# Patient Record
Sex: Male | Born: 1980 | Race: Black or African American | Hispanic: No | Marital: Single | Smoking: Current every day smoker
Health system: Southern US, Community
[De-identification: ages and names within clinical notes are randomized; demographics above are authoritative.]

## PROBLEM LIST (undated history)

## (undated) DIAGNOSIS — M549 Dorsalgia, unspecified: Secondary | ICD-10-CM

## (undated) DIAGNOSIS — M2550 Pain in unspecified joint: Secondary | ICD-10-CM

## (undated) HISTORY — DX: Dorsalgia, unspecified: M54.9

## (undated) HISTORY — PX: ANTERIOR CRUCIATE LIGAMENT REPAIR: SHX115

## (undated) HISTORY — DX: Pain in unspecified joint: M25.50

---

## 1999-03-17 HISTORY — PX: ANTERIOR CRUCIATE LIGAMENT REPAIR: SHX115

## 2002-08-31 ENCOUNTER — Encounter: Payer: Self-pay | Admitting: Orthopedic Surgery

## 2002-08-31 ENCOUNTER — Observation Stay (HOSPITAL_COMMUNITY): Admission: RE | Admit: 2002-08-31 | Discharge: 2002-09-01 | Payer: Self-pay | Admitting: Orthopedic Surgery

## 2003-01-07 ENCOUNTER — Emergency Department (HOSPITAL_COMMUNITY): Admission: EM | Admit: 2003-01-07 | Discharge: 2003-01-07 | Payer: Self-pay | Admitting: Internal Medicine

## 2003-06-28 ENCOUNTER — Emergency Department (HOSPITAL_COMMUNITY): Admission: EM | Admit: 2003-06-28 | Discharge: 2003-06-28 | Payer: Self-pay | Admitting: Emergency Medicine

## 2004-07-21 ENCOUNTER — Emergency Department (HOSPITAL_COMMUNITY): Admission: EM | Admit: 2004-07-21 | Discharge: 2004-07-21 | Payer: Self-pay | Admitting: Emergency Medicine

## 2006-12-28 ENCOUNTER — Emergency Department (HOSPITAL_COMMUNITY): Admission: EM | Admit: 2006-12-28 | Discharge: 2006-12-28 | Payer: Self-pay | Admitting: Emergency Medicine

## 2008-03-25 ENCOUNTER — Emergency Department (HOSPITAL_COMMUNITY): Admission: EM | Admit: 2008-03-25 | Discharge: 2008-03-25 | Payer: Self-pay | Admitting: Emergency Medicine

## 2008-06-14 ENCOUNTER — Emergency Department (HOSPITAL_COMMUNITY): Admission: EM | Admit: 2008-06-14 | Discharge: 2008-06-14 | Payer: Self-pay | Admitting: Emergency Medicine

## 2009-11-04 ENCOUNTER — Emergency Department (HOSPITAL_COMMUNITY): Admission: EM | Admit: 2009-11-04 | Discharge: 2009-11-05 | Payer: Self-pay | Admitting: Emergency Medicine

## 2010-08-01 NOTE — Op Note (Signed)
Martin Jones, Martin Jones [facility]                     ACCOUNT NO.:  [id number]   MEDICAL RECORD NO.:  [id number]                   PATIENT TYPE:  AMB   LOCATION:  DAY                                  FACILITY:  [facility]   PHYSICIAN:  Marlowe Kays, M.D.               DATE OF BIRTH:  08/25/1980   DATE OF PROCEDURE:  08/31/2002  DATE OF DISCHARGE:                                 OPERATIVE REPORT   PREOPERATIVE DIAGNOSIS:  Torn anterior cruciate ligament, left knee.   POSTOPERATIVE DIAGNOSIS:  Torn anterior cruciate ligament, left knee.   OPERATION:  Left knee arthroscopy and ACL reconstruction using mid third  patellar tendon technique.   SURGEON:  Illene Labrador. Aplington, M.D.   ASSISTANTS:  1. Nurse.  2. Almedia Balls. Ranell Patrick, M.D.   ANESTHESIA:  General.   PATHOLOGY AND JUSTIFICATION FOR PROCEDURE:  He injured his left knee in  April with a subsequent MRI demonstrating a torn ACL and no torn menisci.  He is here at this time for a reconstruction.   DESCRIPTION OF PROCEDURE:  Prophylactic antibiotics, satisfactory general  anesthesia, pneumatic tourniquet applied, thigh stabilizer over the  tourniquet.  Left leg was prepped with DuraPrep and draped in a sterile  field.  Anterior medial and lateral portals were marked out as well as  incision for the graft taking, starting about the mid portion of the patella  and biased to the medial side of the patellar tendon, curving down over the  proximal medial plateau medial to the tibia tubercle.  I then did my routine  arthroscopy of the joint with a superior medial inflow and found no  abnormalities other than the torn ACL.  Almedia Balls. Norris, M.D. then assisted  me in taking the graft with the incision as outlined, going down to the  patellar tendon in the patella, and we measured the patellar tendon at  roughly 36 mm in width, so I took the middle 10 mm with a 10 mm double knife  and on the tibia and patella, used cutting cautery to outline the  area for  bone resection.  The plan was to take roughly 20 mm from the patella and 25  from the tibia.  Using microsaw and using the oblique cuts and removed the  patellar bone without complication and then removed the tibial bone without  complication.  Dr. Ranell Patrick then prepared the graft, sizing, smoothing, and  placing three transverse drill holes with #1 PDS in the femoral component  and #5 Ethibond for the tibial component.  In the meantime, I was removing  the remnants of the ACL with 4.2 shaver and punch and then began the  notchplasty using a combination of curved osteotome, bur, and 4.2 shaver.  PCL was intact.  Remnants of the ACL were removed all the way into the  posterior and around the top position, and we left a little remaining bone  there as measured by  probe.  Then went ahead and made our tibial tunnel.  The tendinous portion of the graft measured 104 mm, so in order to try and  minimize bone protruding through the tibia, we obliqued the hole at 60  degrees rather than the usual 55 degrees.  The guide was placed and guidepin  inserted and overdrilled with a C reamer.  We then smoothed out bone from  around the tibial surface, and I then placed the footprint guide into the  posterior notch and made a little bit of the mark there with a guidepin and  found this was in good position, and we then went ahead and overdrilled with  the reamer, going to about 28 mm deep for the 20 mm graft.  After smoothing  up the external surface of the femur as well with the shaver, we placed a  two-pin passer.  I was then able to get the guidewire up into the femur  because of the angle, but we went ahead and pulled the graft through and  rotated it so it fit nicely in the femur and then placed the guidepin free-  hand through the anterior medial portal followed by 7 mm x 20 interference  screw.  This was tightly screwed with nice fixation of the graft.  We then  adjusted the tension on the  graft in the tibia, and there was some  protrusion of the graft despite the above-mentioned change of the angle.  We  took pictures, and the back end of the graft was nicely in the femur, and  there was no impingement in extension and then with tension adjusted  appropriately distally for the isometric tightness, I used a combination of  a bone staple proximal to the graft and distally and AO screw with washer  which went through both cortices, tightening the excision sutures on the  graft around the wire beneath the washer.  This gave a nice stable fixation  to the graft.  I then released the tourniquet at 2 hours and 7 minutes of  tourniquet time.  Marcaine 0.5% plain was placed in the wounds and portals,  and the periosteum over the staples and screw were closed with interrupted  #1 Vicryl.  Cancellous bone was placed in the patellar groove left by the  graft, and this was closed with #1 Vicryl in both the tendon and tendon  sheath overlying it with interrupted #1 Vicryl as a unit.  We continued the  closure distally.  I then introduced into the joint 20 mL of 0.5% Marcaine  with adrenalin and 4 mg of morphine.  The subcutaneous tissue was closed  with 2-0 Vicryl and all wounds with staples.  Betadine, Adaptic dry sterile  dressing were applied followed by knee immobilizer.  He tolerated the  procedure well and was taken to the recovery room in satisfactory condition  with no known complications.                                               Marlowe Kays, M.D.    JA/MEDQ  D:  08/31/2002  T:  08/31/2002  Job:  [id number]

## 2012-08-31 ENCOUNTER — Emergency Department (HOSPITAL_COMMUNITY)
Admission: EM | Admit: 2012-08-31 | Discharge: 2012-08-31 | Disposition: A | Discharge status: Home / Self Care | Payer: BC Managed Care – PPO | Attending: Emergency Medicine | Admitting: Emergency Medicine

## 2012-08-31 ENCOUNTER — Encounter (HOSPITAL_COMMUNITY): Payer: Self-pay | Admitting: *Deleted

## 2012-08-31 DIAGNOSIS — K029 Dental caries, unspecified: Secondary | ICD-10-CM | POA: Insufficient documentation

## 2012-08-31 DIAGNOSIS — L259 Unspecified contact dermatitis, unspecified cause: Secondary | ICD-10-CM | POA: Insufficient documentation

## 2012-08-31 DIAGNOSIS — K089 Disorder of teeth and supporting structures, unspecified: Secondary | ICD-10-CM | POA: Insufficient documentation

## 2012-08-31 DIAGNOSIS — K0889 Other specified disorders of teeth and supporting structures: Secondary | ICD-10-CM

## 2012-08-31 DIAGNOSIS — F17200 Nicotine dependence, unspecified, uncomplicated: Secondary | ICD-10-CM | POA: Insufficient documentation

## 2012-08-31 MED ORDER — PENICILLIN V POTASSIUM 250 MG PO TABS: 250.0000 mg | ORAL_TABLET | Freq: Four times a day (QID) | ORAL | Status: DC

## 2012-08-31 MED ORDER — HYDROCODONE-ACETAMINOPHEN 5-325 MG PO TABS: ORAL_TABLET | ORAL | Status: DC

## 2012-08-31 MED ORDER — PREDNISONE 10 MG PO TABS: ORAL_TABLET | ORAL | Status: DC

## 2012-08-31 MED ORDER — PREDNISONE 50 MG PO TABS
60.0000 mg | ORAL_TABLET | Freq: Once | ORAL | Status: AC
  Administered 2012-08-31: 60 mg via ORAL
  Filled 2012-08-31: qty 1

## 2012-08-31 MED ORDER — HYDROXYZINE HCL 25 MG PO TABS: 25.0000 mg | ORAL_TABLET | Freq: Four times a day (QID) | ORAL | Status: DC | PRN

## 2012-08-31 NOTE — ED Notes (Signed)
Rash to lt arm Headache due to dental pain

## 2012-08-31 NOTE — ED Provider Notes (Signed)
History     CSN: [id number]  Arrival date & time 08/31/12  1238   First MD Initiated Contact with Patient 08/31/12 1255      Chief Complaint  Patient presents with  . Rash     HPI Pt was seen at 1255.  Per pt, c/o gradual onset and persistence of constant left upper teeth "pain" for the past several days.  Denies fevers, no intra-oral edema, no facial swelling, no dysphagia, no neck pain.   The condition is aggravated by nothing. The condition is relieved by nothing. Pt also c/o gradual onset and persistence of constant "itchy rash" to LUE that began approx 3 to 4 days ago. Pt states he "might have touched something" before the rash began. Denies open wounds, no hives, no SOB, no wheezing/stridor, no intra-oral edema.    History reviewed. No pertinent past medical history.  Past Surgical History  Procedure Laterality Date  . Anterior cruciate ligament repair       History  Substance Use Topics  . Smoking status: Current Every Day Smoker  . Smokeless tobacco: Not on file  . Alcohol Use: Yes      Review of Systems ROS: Statement: All systems negative except as marked or noted in the HPI; Constitutional: Negative for fever and chills. ; ; Eyes: Negative for eye pain and discharge. ; ; ENMT: Positive for dental caries, dental hygiene poor and toothache. Negative for ear pain, bleeding gums, dental injury, facial deformity, facial swelling, hoarseness, nasal congestion, sinus pressure, sore throat, throat swelling and tongue swollen. ; ; Cardiovascular: Negative for chest pain, palpitations, diaphoresis, dyspnea and peripheral edema. ; ; Respiratory: Negative for cough, wheezing and stridor. ; ; Gastrointestinal: Negative for nausea, vomiting, diarrhea and abdominal pain. ; ; Genitourinary: Negative for dysuria, flank pain and hematuria. ; ; Musculoskeletal: Negative for back pain and neck pain. ; ; Skin: +itching rash. Negative for skin lesion. ; ; Neuro: Negative for headache,  lightheadedness and neck stiffness. ;    Allergies  Review of patient's allergies indicates no known allergies.  Home Medications   Current Outpatient Rx  Name  Route  Sig  Dispense  Refill  . HYDROcodone-acetaminophen (NORCO/VICODIN) 5-325 MG per tablet      1 or 2 tabs PO q6 hours prn pain   20 tablet   0   . hydrOXYzine (ATARAX/VISTARIL) 25 MG tablet   Oral   Take 1 tablet (25 mg total) by mouth every 6 (six) hours as needed for itching.   15 tablet   0   . penicillin v potassium (VEETID) 250 MG tablet   Oral   Take 1 tablet (250 mg total) by mouth 4 (four) times daily.   20 tablet   0   . predniSONE (DELTASONE) 10 MG tablet      Take 5 tablets PO x2 days, then take 4 tabs PO x3 days, then 3 tabs PO x3 days, then 2 tabs PO x3 days, then 1 tab PO x3 days   40 tablet   0     BP 132/85  Pulse 77  Temp(Src) 98.7 F (37.1 C) (Oral)  Resp 18  Ht 5\' 9"  (1.753 m)  Wt 222 lb (100.699 kg)  BMI 32.77 kg/m2  SpO2 99%  Physical Exam 1300: Physical examination: Vital signs and O2 SAT: Reviewed; Constitutional: Well developed, Well nourished, Well hydrated, In no acute distress; Head and Face: Normocephalic, Atraumatic; Eyes: EOMI, PERRL, No scleral icterus; ENMT: Mouth and pharynx normal, Poor  dentition, Widespread dental decay, Left TM normal, Right TM normal, Mucous membranes moist, +upper left premolars and molars with extensive dental decay.  No gingival erythema, edema, fluctuance, or drainage.  No intra-oral edema. No submandibular or sublingual edema. No hoarse voice, no drooling, no stridor.  ; Neck: Supple, Full range of motion, No lymphadenopathy; Cardiovascular: Regular rate and rhythm, No murmur, rub, or gallop; Respiratory: Breath sounds clear & equal bilaterally, No rales, rhonchi, wheezes, Normal respiratory effort/excursion; Chest: Nontender, Movement normal; Extremities: Pulses normal, No tenderness, No edema; Neuro: AA&Ox3, Major CN grossly intact.  No gross  focal motor or sensory deficits in extremities.; Skin: Color normal, No hives, No petechiae, Warm, Dry. +small papulovesicular rash with excoriations to LUE.    ED Course  Procedures     MDM  MDM Reviewed: previous chart, nursing note and vitals     1310:  Will tx contact dermatitis symptomatically at this time. Pt encouraged to f/u with dentist or oral surgeon for his dental needs for good continuity of care and definitive treatment.  Verb understanding.         Laray Anger, DO 08/31/12 2124

## 2012-10-01 ENCOUNTER — Encounter: Payer: Self-pay | Admitting: *Deleted

## 2012-10-03 ENCOUNTER — Encounter: Payer: Self-pay | Admitting: Family Medicine

## 2012-10-03 ENCOUNTER — Ambulatory Visit (INDEPENDENT_AMBULATORY_CARE_PROVIDER_SITE_OTHER): Payer: BC Managed Care – PPO | Admitting: Family Medicine

## 2012-10-03 VITALS — BP 132/82 | Ht 68.0 in | Wt 215.4 lb

## 2012-10-03 DIAGNOSIS — E669 Obesity, unspecified: Secondary | ICD-10-CM | POA: Insufficient documentation

## 2012-10-03 DIAGNOSIS — E785 Hyperlipidemia, unspecified: Secondary | ICD-10-CM

## 2012-10-03 DIAGNOSIS — Z0000 Encounter for general adult medical examination without abnormal findings: Secondary | ICD-10-CM

## 2012-10-03 NOTE — Patient Instructions (Signed)
You have had labs ordered as part of today's visit. These test are necessary for us to provide good care. Failure to complete the labs in a timely basis can cause significant risk to your health. It is your responsibility to do the test. These orders are good for 30 days. After 30 days they are cancelled by the system and require new orders. Labs are drawn at [facility], located across from the hospital.Please do your test as soon as possible!! All results are forwarded to you via phone call for urgent findings and via "My Chart" or letter for routine findings.Letters typically take 7 to 10 days for you to receive.If you don't receive results within 2 weeks please call us for the results.  In the future,for chronic visits we encourage you to have labs ordered and completed a few days before your visit so we can have a detailed discussion about the results. Doing labs for chronic health issues ( thyroid,diabetes, cholesterol,etc.) before your visit allows for you to have a more thorough and efficient visit.  Lab orders can be given by our office to you by request.  

## 2012-10-03 NOTE — Progress Notes (Signed)
  Subjective:    Patient ID: Martin Jones, male    DOB: Jun 17, 1980, 32 y.o.   MRN: [id number]  HPI overall this patient is doing fairly well he has had some problems in the past with his knees but nothing recently. He does do some running he tries to exercise try to lose weight he still smokes although he states he is trying to cut back and quit. He knows he should. He denies any wheezing or shortness of breath. Patient denies rectal bleeding or hematuria. He states in the past his cholesterol then elevated family history noncontributory no premature heart or cancers    Review of Systems  Constitutional: Negative for fever, activity change and appetite change.  HENT: Negative for congestion, rhinorrhea and neck pain.   Eyes: Negative for discharge.  Respiratory: Negative for cough and wheezing.   Cardiovascular: Negative for chest pain.  Gastrointestinal: Negative for vomiting, abdominal pain and blood in stool.  Genitourinary: Negative for frequency and difficulty urinating.  Skin: Negative for rash.  Allergic/Immunologic: Negative for environmental allergies and food allergies.  Neurological: Negative for weakness and headaches.  Psychiatric/Behavioral: Negative for agitation.       Objective:   Physical Exam  Constitutional: He appears well-developed and well-nourished.  HENT:  Head: Normocephalic and atraumatic.  Right Ear: External ear normal.  Left Ear: External ear normal.  Nose: Nose normal.  Mouth/Throat: Oropharynx is clear and moist.  Eyes: EOM are normal. Pupils are equal, round, and reactive to light.  Neck: Normal range of motion. Neck supple. No thyromegaly present.  Cardiovascular: Normal rate, regular rhythm and normal heart sounds.   No murmur heard. Pulmonary/Chest: Effort normal and breath sounds normal. No respiratory distress. He has no wheezes.  Abdominal: Soft. Bowel sounds are normal. He exhibits no distension and no mass. There is no tenderness.   Genitourinary: Penis normal.  Musculoskeletal: Normal range of motion. He exhibits no edema.  Lymphadenopathy:    He has no cervical adenopathy.  Neurological: He is alert. He exhibits normal muscle tone.  Skin: Skin is warm and dry. No erythema.  Psychiatric: He has a normal mood and affect. His behavior is normal. Judgment normal.          Assessment & Plan:  Wellness exam-safety measures, dietary measures, exercise all reviewed. Try to lose weight was also reviewed in addition to this lab work was ordered await the results.

## 2012-10-12 LAB — LIPID PANEL
HDL: 36 mg/dL — ABNORMAL LOW (ref >39)
LDL Cholesterol: 109 mg/dL — ABNORMAL HIGH (ref 0–99)
Triglycerides: 90 mg/dL (ref <150)
VLDL: 18 mg/dL (ref 0–40)

## 2012-10-12 LAB — BASIC METABOLIC PANEL
BUN: 10 mg/dL (ref 6–23)
Chloride: 101 mEq/L (ref 96–112)
Potassium: 4.2 mEq/L (ref 3.5–5.3)

## 2012-10-13 LAB — HIV ANTIBODY (ROUTINE TESTING W REFLEX): HIV: NONREACTIVE

## 2012-10-13 LAB — RPR

## 2013-04-07 ENCOUNTER — Ambulatory Visit (HOSPITAL_COMMUNITY)
Admission: RE | Admit: 2013-04-07 | Discharge: 2013-04-07 | Disposition: A | Discharge status: Home / Self Care | Payer: Worker's Compensation | Source: Ambulatory Visit | Attending: Orthopedic Surgery | Admitting: Orthopedic Surgery

## 2013-04-07 DIAGNOSIS — M25512 Pain in left shoulder: Secondary | ICD-10-CM | POA: Insufficient documentation

## 2013-04-07 DIAGNOSIS — IMO0001 Reserved for inherently not codable concepts without codable children: Secondary | ICD-10-CM | POA: Insufficient documentation

## 2013-04-07 DIAGNOSIS — M25519 Pain in unspecified shoulder: Secondary | ICD-10-CM | POA: Insufficient documentation
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
## 2013-04-11 ENCOUNTER — Ambulatory Visit (HOSPITAL_COMMUNITY)
Admission: RE | Admit: 2013-04-11 | Discharge: 2013-04-11 | Disposition: A | Discharge status: Home / Self Care | Payer: Worker's Compensation | Source: Ambulatory Visit | Attending: Family Medicine | Admitting: Family Medicine

## 2013-04-11 DIAGNOSIS — M25519 Pain in unspecified shoulder: Secondary | ICD-10-CM | POA: Diagnosis not present

## 2013-04-11 DIAGNOSIS — M25619 Stiffness of unspecified shoulder, not elsewhere classified: Secondary | ICD-10-CM | POA: Insufficient documentation

## 2013-04-11 DIAGNOSIS — IMO0001 Reserved for inherently not codable concepts without codable children: Secondary | ICD-10-CM | POA: Diagnosis not present

## 2013-04-11 NOTE — Progress Notes (Signed)
Occupational Therapy Treatment Patient Details  Name: Martin Jones MRN: [id number] Date of Birth: 08-Oct-1980  Today's Date: 04/11/2013 Time: 4098-1191 OT Time Calculation (min): 38 min Manual therapy [phone] 23' Therapeutic exercises [phone] 15' Visit#: 2 of 12  Re-eval: 05/05/13    Authorization: [employer] - [employer]  Authorization Time Period: unknown at time of this evaluation  Authorization Visit#: 2 of    Subjective Symptoms/Limitations Symptoms: S:  I havent done too many of my exercises I guess because I am busy  Pain Assessment Currently in Pain?: Yes Pain Score: 5  Pain Location: Shoulder Pain Orientation: Left Pain Type: Acute pain  Precautions/Restrictions     Exercise/Treatments Supine Protraction: PROM;AAROM;10 reps Horizontal ABduction: PROM;AAROM;10 reps External Rotation: PROM;AAROM;10 reps Internal Rotation: PROM;AAROM;10 reps Flexion: PROM;AAROM;10 reps ABduction: PROM;AAROM;10 reps ROM / Strengthening / Isometric Strengthening UBE (Upper Arm Bike): 3' in reverse at 1.0 for shoulder stability Proximal Shoulder Strengthening, Supine: 10 times in supine without rest breaks Ball on Wall: 1 min with arm flexed to 90 and 1 min with arm abducted to 90      Manual Therapy Manual Therapy: Myofascial release Myofascial Release: MFR  and manual stretching to left upper arm, scapular, and shoulder region to decrease pain and fascial restrictions and increase pain free mobility.   Occupational Therapy Assessment and Plan OT Assessment and Plan Clinical Impression Statement: A:  Patient was able to tolerate MFR to left shoulder without difficulty or increased pain this date.  WFL PROM and AAROM in supine this date.  OT Plan: P:  Begin AROM in supine and/or seated, add x to v and w arms for scapular stability and proximal shoulder strength.   Goals Home Exercise Program Pt/caregiver will Perform Home Exercise Program: For increased ROM;For increased  strengthening Short Term Goals Time to Complete Short Term Goals: 3 weeks Short Term Goal 1: Patient will be educated on HEP Short Term Goal 1 Progress: Progressing toward goal Short Term Goal 2: Patient will imporve LUE strenghth to >/= 3+/5 for increased ability to participate in daily activities Short Term Goal 2 Progress: Progressing toward goal Short Term Goal 3: Patient will decrease pain in his left arm to </-=4/10 with daily activities  Short Term Goal 3 Progress: Progressing toward goal Short Term Goal 4: Patient will improve LUE range to [facility] to complete daily activites independently  Short Term Goal 4 Progress: Progressing toward goal Short Term Goal 5: Patient will decrease fascial restrictions to trace in LUE  Short Term Goal 5 Progress: Progressing toward goal Long Term Goals Time to Complete Long Term Goals: 6 weeks Long Term Goal 1: APatient will return to highest level of independence with all B/IADL, work and leisure activities  Long Term Goal 1 Progress: Progressing toward goal Long Term Goal 2: Patient will have at least 4+/5 strengh throughout LUE for increased ability to perform job duties safely and return to participation in leisure activities Long Term Goal 2 Progress: Progressing toward goal Long Term Goal 3: Patient will decreased complaint of pain in LUE to </=2/10 when completing work activities Long Term Goal 3 Progress: Progressing toward goal Long Term Goal 4: Patient will have zero fascial restrictions LUE  Long Term Goal 4 Progress: Progressing toward goal  Problem List Patient Active Problem List   Diagnosis Date Noted  . Left shoulder pain 04/07/2013  . Obesity, BMI unknown 10/03/2012    End of Session Activity Tolerance: Patient tolerated treatment well General Behavior During Therapy: [facility] for tasks  assessed/performed  GO    Shirlean MylarBethany H. Ranata Laughery, OTR/L  04/11/2013, 11:04 AM

## 2013-04-14 ENCOUNTER — Ambulatory Visit (HOSPITAL_COMMUNITY)
Admission: RE | Admit: 2013-04-14 | Discharge: 2013-04-14 | Disposition: A | Discharge status: Home / Self Care | Payer: Worker's Compensation | Source: Ambulatory Visit | Attending: Family Medicine | Admitting: Family Medicine

## 2013-04-14 DIAGNOSIS — IMO0001 Reserved for inherently not codable concepts without codable children: Secondary | ICD-10-CM | POA: Diagnosis not present

## 2013-04-14 NOTE — Progress Notes (Signed)
Occupational Therapy Treatment Patient Details  Name: Martin Jones MRN: [id number] Date of Birth: 01/04/1981  Today's Date: 04/14/2013 Time: 5956-38750806-0845 OT Time Calculation (min): 39 min Manual [id number] (23') TherExercises [id number] (16')  Visit#: 3 of 12  Re-eval: 05/05/13    Authorization: [employer] - [employer]  Authorization Time Period: unknown at time of this evaluation  Authorization Visit#: 3 of    Subjective Symptoms/Limitations Symptoms: S:  My pain is  low.... not none but very low.  Pain Assessment Currently in Pain?: Yes Pain Score: 2  Pain Location: Shoulder Pain Orientation: Left Pain Type: Acute pain   Exercise/Treatments Supine Protraction: PROM;AAROM;10 reps Horizontal ABduction: PROM;AAROM;10 reps External Rotation: PROM;AAROM;10 reps Internal Rotation: PROM;AAROM;10 reps Flexion: PROM;AAROM;10 reps ABduction: PROM;AAROM;10 reps   ROM / Strengthening / Isometric Strengthening UBE (Upper Arm Bike): 3' in reverse at 2.0 for shoulder stability Proximal Shoulder Strengthening, Supine: 1' each  Ball on Wall: 1 min with arm flexed to 90 and 1 min with arm abducted to 90 with small weighted green ball.       Manual Therapy Manual Therapy: Myofascial release Myofascial Release: minimal fascial restrictions in upper arm, trapezius and scapular regions  Occupational Therapy Assessment and Plan OT Assessment and Plan Clinical Impression Statement: A:  Patient reports significant improvement with pain in his left shoulder.  Patient with good tolerance of exercises this date with min complaint of fatigue following treatment session and min inicreased pain with ABduction and ER supine exercises.   OT Plan: P:  Begin AROM in supine and/or seated, add x to v and w arms for scapular stability and proximal shoulder strength.   Goals Short Term Goals Short Term Goal 1: Patient will be educated on HEP Short Term Goal 1 Progress: Progressing toward goal Short Term  Goal 2: Patient will imporve LUE strenghth to >/= 3+/5 for increased ability to participate in daily activities Short Term Goal 2 Progress: Progressing toward goal Short Term Goal 3: Patient will decrease pain in his left arm to </-=4/10 with daily activities  Short Term Goal 3 Progress: Progressing toward goal Short Term Goal 4: Patient will improve LUE range to [facility] to complete daily activites independently  Short Term Goal 4 Progress: Progressing toward goal Short Term Goal 5: Patient will decrease fascial restrictions to trace in LUE  Short Term Goal 5 Progress: Progressing toward goal Long Term Goals Long Term Goal 1: APatient will return to highest level of independence with all B/IADL, work and leisure activities  Long Term Goal 1 Progress: Progressing toward goal Long Term Goal 2: Patient will have at least 4+/5 strengh throughout LUE for increased ability to perform job duties safely and return to participation in leisure activities Long Term Goal 2 Progress: Progressing toward goal Long Term Goal 3: Patient will decreased complaint of pain in LUE to </=2/10 when completing work activities Long Term Goal 3 Progress: Progressing toward goal Long Term Goal 4: Patient will have zero fascial restrictions LUE  Long Term Goal 4 Progress: Progressing toward goal  Problem List Patient Active Problem List   Diagnosis Date Noted  . Left shoulder pain 04/07/2013  . Obesity, BMI unknown 10/03/2012    End of Session Activity Tolerance: Patient tolerated treatment well General Behavior During Therapy: [facility] for tasks assessed/performed  GO    Velora MediateHeather Mercedees Convery, OTR/L  04/14/2013, 9:28 AM

## 2013-04-17 ENCOUNTER — Ambulatory Visit (HOSPITAL_COMMUNITY)
Admission: RE | Admit: 2013-04-17 | Discharge: 2013-04-17 | Disposition: A | Discharge status: Home / Self Care | Payer: Worker's Compensation | Source: Ambulatory Visit | Attending: Family Medicine | Admitting: Family Medicine

## 2013-04-17 DIAGNOSIS — M25519 Pain in unspecified shoulder: Secondary | ICD-10-CM | POA: Insufficient documentation

## 2013-04-17 DIAGNOSIS — M25619 Stiffness of unspecified shoulder, not elsewhere classified: Secondary | ICD-10-CM | POA: Insufficient documentation

## 2013-04-17 DIAGNOSIS — IMO0001 Reserved for inherently not codable concepts without codable children: Secondary | ICD-10-CM | POA: Insufficient documentation

## 2013-04-17 NOTE — Progress Notes (Signed)
Occupational Therapy Treatment Patient Details  Name: Martin Jones MRN: [id number] Date of Birth: 03/19/1980  Today's Date: 04/17/2013 Time: [id number] OT Time Calculation (min): 51 min Manual [id number] (20') TherExercises  [id number] (31)   Visit#: 4 of 12  Re-eval: 05/05/13    Authorization: [employer] - [employer]  Authorization Time Period: unknown at time of this evaluation  Authorization Visit#: 4 of    Subjective Symptoms/Limitations Symptoms: S:  I really feel like it is doing better Pain Assessment Currently in Pain?: Yes Pain Score: 3  Pain Location: Shoulder Pain Orientation: Left Pain Type: Acute pain     Exercise/Treatments Supine Protraction: AAROM;10 reps Horizontal ABduction: AAROM;10 reps External Rotation: AAROM;10 reps Internal Rotation: AAROM;10 reps Flexion: AAROM;10 reps ABduction: AAROM;10 reps Seated Elevation: AROM;10 reps Extension: AROM;10 reps Protraction: AROM;10 reps Horizontal ABduction: AROM;10 reps External Rotation: AROM;10 reps Internal Rotation: AROM;10 reps Flexion: AROM;10 reps Abduction: AROM;10 reps   ROM / Strengthening / Isometric Strengthening UBE (Upper Arm Bike): 3' in reverse at 2.0 for shoulder stability "W" Arms: x 10 reps X to V Arms: x 10 reps  Proximal Shoulder Strengthening, Supine: 1' each  Ball on Wall: 1 min with arm flexed to 90 and 1 min with arm abducted to 90 with small weighted green ball.         Manual Therapy Manual Therapy: Myofascial release Myofascial Release: minimal fascial restrictions in upper arm, trapezius and scapular regionstaken   Occupational Therapy Assessment and Plan OT Assessment and Plan Clinical Impression Statement: A:  Patient demos improving pain, strength and report of functional use of LUE in daily tasks.  He reports feeling much better about using his arm.  Tolerated increased AROM exercises this date.  continues with Latissimus and posterior deltoid pain with abduction  stretching.  OT Plan: P:  Proximal strengthening, wall pushups, prone or sidelying exercises.    Goals Short Term Goals Short Term Goal 1: Patient will be educated on HEP Short Term Goal 1 Progress: Progressing toward goal Short Term Goal 2: Patient will imporve LUE strenghth to >/= 3+/5 for increased ability to participate in daily activities Short Term Goal 2 Progress: Progressing toward goal Short Term Goal 3: Patient will decrease pain in his left arm to </-=4/10 with daily activities  Short Term Goal 3 Progress: Progressing toward goal Short Term Goal 4: Patient will improve LUE range to [facility] to complete daily activites independently  Short Term Goal 4 Progress: Progressing toward goal Short Term Goal 5: Patient will decrease fascial restrictions to trace in LUE  Short Term Goal 5 Progress: Progressing toward goal Long Term Goals Long Term Goal 1: APatient will return to highest level of independence with all B/IADL, work and leisure activities  Long Term Goal 1 Progress: Progressing toward goal Long Term Goal 2: Patient will have at least 4+/5 strengh throughout LUE for increased ability to perform job duties safely and return to participation in leisure activities Long Term Goal 2 Progress: Progressing toward goal Long Term Goal 3: Patient will decreased complaint of pain in LUE to </=2/10 when completing work activities Long Term Goal 3 Progress: Progressing toward goal Long Term Goal 4: Patient will have zero fascial restrictions LUE  Long Term Goal 4 Progress: Progressing toward goal Long Term Goal 5:       Problem List Patient Active Problem List   Diagnosis Date Noted  . Left shoulder pain 04/07/2013  . Obesity, BMI unknown 10/03/2012    End of Session Activity Tolerance:  Patient tolerated treatment well General Behavior During Therapy: [facility] for tasks assessed/performed  GO    Velora Mediate, OTR/L  04/17/2013, 12:32 PM

## 2013-04-20 ENCOUNTER — Ambulatory Visit (HOSPITAL_COMMUNITY)
Admission: RE | Admit: 2013-04-20 | Discharge: 2013-04-20 | Disposition: A | Discharge status: Home / Self Care | Payer: Worker's Compensation | Source: Ambulatory Visit | Attending: Family Medicine | Admitting: Family Medicine

## 2013-04-20 NOTE — Progress Notes (Signed)
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  Authorization Time Period: unknown at time of this evaluation  Authorization Visit#: 5 of    Subjective Symptoms/Limitations Symptoms: "I'm feeling great." Pain Assessment Currently in Pain?: No/denies  Exercise/Treatments Supine Protraction: AAROM;15 reps (2 lb bar) Horizontal ABduction: AAROM;15 reps (2 lb bar) External Rotation: AAROM;15 reps (2 lb bar) Internal Rotation: AAROM;15 reps (2 lb bar) Flexion: AAROM;15 reps (2 lb bar) ABduction: AAROM;15 reps (2 lb bar) Prone  Other Prone Exercises: Hughston Exercises - all 6 at 10 reps each, w 2 second hold for each   ROM / Strengthening / Isometric Strengthening UBE (Upper Arm Bike): 3' in reverse at 3.0 for shoulder stability "W" Arms: x 10 reps X to V Arms: x 10 reps  Proximal Shoulder Strengthening, Supine: 1' each criss cross, up/down; 30sec each counter/clockwise Ball on Wall: 1 min with arm flexed to 90 and 1 min with arm abducted to 90 with small weighted green ball.    Manual Therapy Manual Therapy: Myofascial release Myofascial Release: minimal fascial restrictions in upper arm, trapezius and scapular regions  Occupational Therapy Assessment and Plan OT Assessment and Plan Clinical Impression Statement: A: Pt had no pain this date during session, but did report some soreness after previous session. Pt had painfree AAROM shoulder abduction to approx 120 degrees. Added Hughston Exercises this date - tolerated well. OT Plan: Continue proximal shoulder strengthening. Add Wall pushups.   Goals Short Term Goals Short Term Goal 1: Patient will be educated on HEP Short Term Goal 1 Progress:  Progressing toward goal Short Term Goal 2: Patient will imporve LUE strenghth to >/= 3+/5 for increased ability to participate in daily activities Short Term Goal 2 Progress: Progressing toward goal Short Term Goal 3: Patient will decrease pain in his left arm to </-=4/10 with daily activities  Short Term Goal 3 Progress: Progressing toward goal Short Term Goal 4: Patient will improve LUE range to [facility] to complete daily activites independently  Short Term Goal 4 Progress: Progressing toward goal Short Term Goal 5: Patient will decrease fascial restrictions to trace in LUE  Short Term Goal 5 Progress: Progressing toward goal Long Term Goals Long Term Goal 1: APatient will return to highest level of independence with all B/IADL, work and leisure activities  Long Term Goal 1 Progress: Progressing toward goal Long Term Goal 2: Patient will have at least 4+/5 strengh throughout LUE for increased ability to perform job duties safely and return to participation in leisure activities Long Term Goal 2 Progress: Progressing toward goal Long Term Goal 3: Patient will decreased complaint of pain in LUE to </=2/10 when completing work activities Long Term Goal 3 Progress: Progressing toward goal Long Term Goal 4: Patient will have zero fascial restrictions LUE  Long Term Goal 4 Progress: Progressing toward goal  Problem List Patient Active Problem List   Diagnosis Date Noted  . Left shoulder pain 04/07/2013  . Obesity, BMI unknown 10/03/2012    End of Session Activity Tolerance: Patient tolerated treatment well General Behavior During Therapy: [facility] for tasks assessed/performed  GO    Marry GuanMarie Rawlings, MS, OTR/L [phone]  04/20/2013, 11:59 AM

## 2013-04-24 ENCOUNTER — Ambulatory Visit (HOSPITAL_COMMUNITY)
Admission: RE | Admit: 2013-04-24 | Discharge: 2013-04-24 | Disposition: A | Discharge status: Home / Self Care | Payer: Worker's Compensation | Source: Ambulatory Visit | Attending: Family Medicine | Admitting: Family Medicine

## 2013-04-24 NOTE — Progress Notes (Addendum)
Occupational Therapy Treatment Patient Details  Name: Martin Jones MRN: [id number] Date of Birth: 09/21/1980  Today's Date: 04/24/2013 Time: 0805-0852 OT Time Calculation (min): 47 min Manual [id number] (10') therExercises [id number] (37')  Visit#: 6 of 12  Re-eval: 05/05/13    Authorization: [employer] - [employer]BS  Authorization Time Period: unknown at time of this evaluation  Authorization Visit#: 6 of    Subjective Symptoms/Limitations Symptoms: S:  I have been working but I am still on light duty  Pain Assessment Currently in Pain?: Yes Pain Score: 2  Pain Location: Shoulder Pain Orientation: Left Pain Type: Acute pain   Exercise/Treatments Prone  Other Prone Exercises: Hughston exercises with 2# hand weights over blue thereapy ball x 12 reps each  Therapy Ball Other Therapy Ball Exercises: supine on ball for strengthening exercises to include chest press, horizontal ab/adduction x 12 repss each.  ROM / Strengthening / Isometric Strengthening UBE (Upper Arm Bike): 3' in reverse at 3.5 for shoulder stability, 3' forward with 3.5 for UE endurance/strengthening.   Cybex Press: 10 reps;3 plate Cybex Row: 10 reps;3 plate Pushups: 15 reps (12 reps on ball side of BOSU) Plank: 2 reps;45 seconds;30 seconds (on BOSU, ball side down. ) X to V Arms: x12 reps  Proximal Shoulder Strengthening, Seated: in standing with 2# hand weights x 1min each  Ball on Wall: 1' each with arms flexed to 90 degrees.        Manual Therapy Manual Therapy: Myofascial release Myofascial Release: minimal fascial restrictions in upper arm, trapezius and scapular regions  Occupational Therapy Assessment and Plan OT Assessment and Plan Clinical Impression Statement: A:  Patient wiht improved complaint of pain however he reports increased fatigue in LUE wiht increased functional use.  Attempted wall pushups with patient this date which were too easy, therefore progressed to standard pushups on floor/mat  then on BOSU with fatigue.  Patient with increased complaint of LUE fatigue following treatment this date.  instructed to work on pushups at home at least once a day for scapular strengthening.  OT Plan: P:  cont Hughston exercises over ball, BOSU shoulder stability    Goals Short Term Goals Short Term Goal 1: Patient will be educated on HEP Short Term Goal 1 Progress: Progressing toward goal Short Term Goal 2: Patient will imporve LUE strenghth to >/= 3+/5 for increased ability to participate in daily activities Short Term Goal 2 Progress: Progressing toward goal Short Term Goal 3: Patient will decrease pain in his left arm to </-=4/10 with daily activities  Short Term Goal 3 Progress: Progressing toward goal Short Term Goal 4: Patient will improve LUE range to [facility] to complete daily activites independently  Short Term Goal 4 Progress: Progressing toward goal Short Term Goal 5: Patient will decrease fascial restrictions to trace in LUE  Short Term Goal 5 Progress: Progressing toward goal Long Term Goals Long Term Goal 1: APatient will return to highest level of independence with all B/IADL, work and leisure activities  Long Term Goal 1 Progress: Progressing toward goal Long Term Goal 2: Patient will have at least 4+/5 strengh throughout LUE for increased ability to perform job duties safely and return to participation in leisure activities Long Term Goal 2 Progress: Progressing toward goal Long Term Goal 3: Patient will decreased complaint of pain in LUE to </=2/10 when completing work activities Long Term Goal 3 Progress: Progressing toward goal Long Term Goal 4: Patient will have zero fascial restrictions LUE  Long Term Goal 4 Progress:  Progressing toward goal  Problem List Patient Active Problem List   Diagnosis Date Noted  . Left shoulder pain 04/07/2013  . Obesity, BMI unknown 10/03/2012    End of Session Activity Tolerance: Patient tolerated treatment well General Behavior  During Therapy: [facility] for tasks assessed/performed  GO    Velora Mediate, OTR/L  04/24/2013, 9:55 AM

## 2013-04-26 ENCOUNTER — Ambulatory Visit (HOSPITAL_COMMUNITY)
Admission: RE | Admit: 2013-04-26 | Discharge: 2013-04-26 | Disposition: A | Discharge status: Home / Self Care | Payer: Worker's Compensation | Source: Ambulatory Visit | Attending: Family Medicine | Admitting: Family Medicine

## 2013-04-26 NOTE — Progress Notes (Signed)
Occupational Therapy Treatment Patient Details  Name: Martin Jones MRN: [id number] Date of Birth: 04/24/1980  Today's Date: 04/26/2013 Time: 0813-0850 OT Time Calculation (min): 37 min TherExercises 4098-1191 (37')  Visit#: 7 of 12  Re-eval: 05/05/13    Authorization: [employer] - [employer]  Authorization Time Period: unknown at time of this evaluation  Authorization Visit#: 7 of    Subjective Symptoms/Limitations Symptoms: S:  I really dont have any pain so i have been doing push ups at home  Pain Assessment Currently in Pain?: No/denies  Exercise/Treatments Seated Other Seated Exercises: power column seated for overhead flexion pull down x 12 reps  Prone  Other Prone Exercises: Hughston exercises with 2# hand weights over blue thereapy ball x 15 reps each  Standing External Rotation: AROM;15 reps;Theraband Theraband Level (Shoulder External Rotation): Level 4 (Blue) Internal Rotation: AROM;12 reps;Theraband Theraband Level (Shoulder Internal Rotation): Level 4 (Blue) Other Standing Exercises: high pulls at power column x 12 reps plate 5 ROM / Strengthening / Isometric Strengthening Cybex Press:  (12 reps, plate 6 ) Cybex Row:  (12 reps, plate 6) Pushups: 15 reps (feet on BOSU and hands on foam) Plank: 60 seconds;2 reps (stationary x 1 rep; mountain climers x 10 during second rep ) X to V Arms: x12 reps with 2# hand weights Other ROM/Strengthening Exercises: Plank position to walk hands off foam square laterally x 2 sets x 5 reps each.       Occupational Therapy Assessment and Plan OT Assessment and Plan Clinical Impression Statement: A:  Patient running late this AM, states he has no pain and agrees to skip manual MFR this date.  Patient with improved plank tolerance this date on unstable surface.  He reports cont weakness with IR/ER movements. increased tolerance of Hughston exercises with 2# weights increased reps.  increased weight with cybex exercises with good  tolerance.  initiated theraband exercises.   OT Plan: P:  Strengthening, shoulder stability    Goals Short Term Goals Short Term Goal 1: Patient will be educated on HEP Short Term Goal 1 Progress: Progressing toward goal Short Term Goal 2: Patient will imporve LUE strenghth to >/= 3+/5 for increased ability to participate in daily activities Short Term Goal 2 Progress: Progressing toward goal Short Term Goal 3: Patient will decrease pain in his left arm to </-=4/10 with daily activities  Short Term Goal 3 Progress: Progressing toward goal Short Term Goal 4: Patient will improve LUE range to [facility] to complete daily activites independently  Short Term Goal 4 Progress: Progressing toward goal Short Term Goal 5: Patient will decrease fascial restrictions to trace in LUE  Short Term Goal 5 Progress: Progressing toward goal Long Term Goals Long Term Goal 1: APatient will return to highest level of independence with all B/IADL, work and leisure activities  Long Term Goal 1 Progress: Progressing toward goal Long Term Goal 2: Patient will have at least 4+/5 strengh throughout LUE for increased ability to perform job duties safely and return to participation in leisure activities Long Term Goal 2 Progress: Progressing toward goal Long Term Goal 3: Patient will decreased complaint of pain in LUE to </=2/10 when completing work activities Long Term Goal 4: Patient will have zero fascial restrictions LUE  Long Term Goal 4 Progress: Progressing toward goal  Problem List Patient Active Problem List   Diagnosis Date Noted  . Left shoulder pain 04/07/2013  . Obesity, BMI unknown 10/03/2012    End of Session Activity Tolerance: Patient tolerated treatment  well General Behavior During Therapy: [facility] for tasks assessed/performed  GO    Velora MediateHeather Jamair Cato, OTR/L  04/26/2013, 10:15 AM

## 2013-05-02 ENCOUNTER — Ambulatory Visit (HOSPITAL_COMMUNITY): Payer: Self-pay | Admitting: Specialist

## 2013-05-04 ENCOUNTER — Ambulatory Visit (HOSPITAL_COMMUNITY)
Admission: RE | Admit: 2013-05-04 | Discharge: 2013-05-04 | Disposition: A | Discharge status: Home / Self Care | Payer: Worker's Compensation | Source: Ambulatory Visit | Attending: Family Medicine | Admitting: Family Medicine

## 2013-05-04 ENCOUNTER — Inpatient Hospital Stay (HOSPITAL_COMMUNITY): Admission: RE | Admit: 2013-05-04 | Payer: Self-pay | Source: Ambulatory Visit

## 2013-05-04 NOTE — Progress Notes (Signed)
Occupational Therapy Treatment Patient Details  Name: Martin Jones MRN: [id number] Date of Birth: 01/17/1981  Today's Date: 05/04/2013 Time: 1100-1146 OT Time Calculation (min): 46 min Manual 1100-1110 (10') Therapeutic Exercises 1110-1146 (36')  Visit#: 8 of 12  Re-eval: 05/05/13    Authorization: [employer] - [employer]  Authorization Time Period: unknown at time of this evaluation  Authorization Visit#: 8 of    Subjective Symptoms/Limitations Symptoms: S: "I don't have any pain... but it still doesn't feel normal." Pain Assessment Currently in Pain?: No/denies  Exercise/Treatments Seated Other Seated Exercises: power column seated for overhead extension pull down x 20 reps (8 bar) Prone  Other Prone Exercises: Hughston exercises with 2# hand weights over blue thereapy ball x 20 reps each    ROM / Strengthening / Isometric Strengthening Cybex Press: 20 reps (6 plate) Cybex Row: 20 reps (6 plate) Plank:  (25" x1, 45" x3) Proximal Shoulder Strengthening, Seated: in standing with 2# hand weights x 1min each  Ball on Wall: 1' each arm flexed to 90 and abducted to 90 Other ROM/Strengthening Exercises: Plank position to walk hands off Bosu laterally and anteriorly. 30" x2 reps      Manual Therapy Manual Therapy: Myofascial release Myofascial Release: minimal fascial restrictions in upper arm, trapezius and scapular regions  Occupational Therapy Assessment and Plan OT Assessment and Plan Clinical Impression Statement: A: Pt with minimal fascial restrictions this date in upper trap region. Pt remained fatigued this session - also commented that he has not continued hughston exercises at home. Required cueing for plank positioning. OT Plan: P: Re-Evaluation. Hughston exercises (improve toelrance of current reps), quadruped activities. Introduce theraband HEP.   Goals Short Term Goals Short Term Goal 1: Patient will be educated on HEP Short Term Goal 1 Progress: Progressing  toward goal Short Term Goal 2: Patient will imporve LUE strenghth to >/= 3+/5 for increased ability to participate in daily activities Short Term Goal 2 Progress: Progressing toward goal Short Term Goal 3: Patient will decrease pain in his left arm to </-=4/10 with daily activities  Short Term Goal 3 Progress: Progressing toward goal Short Term Goal 4: Patient will improve LUE range to [facility] to complete daily activites independently  Short Term Goal 4 Progress: Progressing toward goal Short Term Goal 5: Patient will decrease fascial restrictions to trace in LUE  Short Term Goal 5 Progress: Progressing toward goal Long Term Goals Long Term Goal 1: APatient will return to highest level of independence with all B/IADL, work and leisure activities  Long Term Goal 1 Progress: Progressing toward goal Long Term Goal 2: Patient will have at least 4+/5 strengh throughout LUE for increased ability to perform job duties safely and return to participation in leisure activities Long Term Goal 2 Progress: Progressing toward goal Long Term Goal 3: Patient will decreased complaint of pain in LUE to </=2/10 when completing work activities Long Term Goal 3 Progress: Progressing toward goal Long Term Goal 4: Patient will have zero fascial restrictions LUE  Long Term Goal 4 Progress: Progressing toward goal  Problem List Patient Active Problem List   Diagnosis Date Noted  . Left shoulder pain 04/07/2013  . Obesity, BMI unknown 10/03/2012    End of Session Activity Tolerance: Patient tolerated treatment well General Behavior During Therapy: [facility] for tasks assessed/performed  GO    Milon DikesRawlings, Machele Deihl A 05/04/2013, 12:00 PM

## 2013-05-09 ENCOUNTER — Inpatient Hospital Stay (HOSPITAL_COMMUNITY): Admission: RE | Admit: 2013-05-09 | Payer: Self-pay | Source: Ambulatory Visit

## 2013-05-11 ENCOUNTER — Ambulatory Visit (HOSPITAL_COMMUNITY): Payer: Self-pay

## 2013-05-16 ENCOUNTER — Ambulatory Visit (HOSPITAL_COMMUNITY)
Admission: RE | Admit: 2013-05-16 | Discharge: 2013-05-16 | Disposition: A | Discharge status: Home / Self Care | Payer: Worker's Compensation | Source: Ambulatory Visit | Attending: Family Medicine | Admitting: Family Medicine

## 2013-05-16 DIAGNOSIS — M25619 Stiffness of unspecified shoulder, not elsewhere classified: Secondary | ICD-10-CM | POA: Insufficient documentation

## 2013-05-16 DIAGNOSIS — IMO0001 Reserved for inherently not codable concepts without codable children: Secondary | ICD-10-CM | POA: Insufficient documentation

## 2013-05-16 DIAGNOSIS — M25519 Pain in unspecified shoulder: Secondary | ICD-10-CM | POA: Insufficient documentation

## 2013-05-16 NOTE — Evaluation (Signed)
Occupational Therapy Re-Evaluation  Patient Details  Name: Martin Jones MRN: [id number] Date of Birth: 1980-12-01  Today's Date: 05/16/2013 Time: 4098-1191 OT Time Calculation (min): 15 min ROM/MMT assessment 15' Visit#: 9 of 12  Re-eval: 05/16/13  Assessment Diagnosis: left shoulder dislocation   Authorization: [place]  Past Medical History: No past medical history on file. Past Surgical History:  Past Surgical History  Procedure Laterality Date  . Anterior cruciate ligament repair    . Anterior cruciate ligament repair Left 2001    Subjective  S:  It really feels back to normal.   Pain Assessment Currently in Pain?: No/denies Pain Score: 0-No pain   Assessment ADL/Vision/Perception ADL ADL Comments: able to complete all B/IADLs and work activities.  Has not resumed work out or weightlifting and feels that he could do so.   Additional Assessments LUE AROM (degrees) LUE Overall AROM Comments: assessed in standing ( 04/07/13) Left Shoulder Flexion: 170 Degrees (94) Left Shoulder ABduction: 165 Degrees (73) Left Shoulder Internal Rotation: 70 Degrees (70) Left Shoulder External Rotation: 80 Degrees (49) LUE Strength LUE Overall Strength Comments: assessed in standing ER/IR with shoulder abducted to 90 (04/07/13) Left Shoulder Flexion: 5/5 (3-/5) Left Shoulder Extension: 5/5 (3/5) Left Shoulder ABduction: 5/5 (2+/5) Left Shoulder Internal Rotation: 5/5 (3+/5) Left Shoulder External Rotation: 5/5 (2/5) Palpation Palpation: trace fascial restrictions (minimal fascial restrictions)     Occupational Therapy Assessment and Plan OT Assessment and Plan Clinical Impression Statement: A:  Patient has met all OT goals.  He is functioning at his prior level of independence with all B/IADLs and work activities.  He feels ready for dc at this time.  OT Plan: P:  DC from skilled OT interventions this date.   Goals Short Term Goals Short Term Goal 1: Patient will  be educated on HEP Short Term Goal 1 Progress: Met Short Term Goal 2: Patient will imporve LUE strenghth to >/= 3+/5 for increased ability to participate in daily activities Short Term Goal 2 Progress: Met Short Term Goal 3: Patient will decrease pain in his left arm to </-=4/10 with daily activities  Short Term Goal 3 Progress: Met Short Term Goal 4: Patient will improve LUE range to [facility] to complete daily activites independently  Short Term Goal 4 Progress: Met Short Term Goal 5: Patient will decrease fascial restrictions to trace in LUE  Short Term Goal 5 Progress: Met Long Term Goals Long Term Goal 1: APatient will return to highest level of independence with all B/IADL, work and leisure activities  Long Term Goal 1 Progress: Met Long Term Goal 2: Patient will have at least 4+/5 strengh throughout LUE for increased ability to perform job duties safely and return to participation in leisure activities Long Term Goal 2 Progress: Met Long Term Goal 3: Patient will decreased complaint of pain in LUE to </=2/10 when completing work activities Long Term Goal 3 Progress: Met Long Term Goal 4: Patient will have zero fascial restrictions LUE  Long Term Goal 4 Progress: Met  Problem List Patient Active Problem List   Diagnosis Date Noted  . Left shoulder pain 04/07/2013  . Obesity, BMI unknown 10/03/2012    End of Session Activity Tolerance: Patient tolerated treatment well General Behavior During Therapy: [facility] for tasks assessed/performed OT Plan of Care OT Home Exercise Plan: shoulder strengthening exercises OT Patient Instructions: handout given and scanned Consulted and Agree with Plan of Care: Patient  [place], OTR/L  05/16/2013,  10:29 AM  Physician Documentation Your signature is required to indicate approval of the treatment plan as stated above.  Please sign and either send electronically or make a copy of this report for your files and return this physician  signed original.  Please mark one 1.__approve of plan  2. ___approve of plan with the following conditions.   ______________________________                                                          _____________________ Physician Signature                                                                                                             Date

## 2013-05-18 ENCOUNTER — Inpatient Hospital Stay (HOSPITAL_COMMUNITY): Admission: RE | Admit: 2013-05-18 | Payer: Self-pay | Source: Ambulatory Visit

## 2013-11-07 ENCOUNTER — Emergency Department (HOSPITAL_COMMUNITY)
Admission: EM | Admit: 2013-11-07 | Discharge: 2013-11-07 | Disposition: A | Discharge status: Home / Self Care | Payer: Managed Care, Other (non HMO) | Attending: Emergency Medicine | Admitting: Emergency Medicine

## 2013-11-07 ENCOUNTER — Encounter (HOSPITAL_COMMUNITY): Payer: Self-pay | Admitting: Emergency Medicine

## 2013-11-07 DIAGNOSIS — J029 Acute pharyngitis, unspecified: Secondary | ICD-10-CM | POA: Diagnosis not present

## 2013-11-07 DIAGNOSIS — F17200 Nicotine dependence, unspecified, uncomplicated: Secondary | ICD-10-CM | POA: Diagnosis not present

## 2013-11-07 MED ORDER — HYDROCODONE-ACETAMINOPHEN 7.5-325 MG/15ML PO SOLN
10.0000 mL | Freq: Once | ORAL | Status: AC
  Administered 2013-11-07: 10 mL via ORAL
  Filled 2013-11-07: qty 15

## 2013-11-07 MED ORDER — GUAIFENESIN-CODEINE 100-10 MG/5ML PO SYRP: 10.0000 mL | ORAL_SOLUTION | Freq: Three times a day (TID) | ORAL | Status: DC | PRN

## 2013-11-07 MED ORDER — MAGIC MOUTHWASH W/LIDOCAINE: 5.0000 mL | Freq: Three times a day (TID) | ORAL | Status: DC | PRN

## 2013-11-07 NOTE — ED Notes (Signed)
Pt c/o congestion and sore throat. 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 States it has been going on for two weeks.

## 2013-11-07 NOTE — Discharge Instructions (Signed)

## 2013-11-09 NOTE — ED Provider Notes (Signed)
CSN: [id number]     Arrival date & time 11/07/13  2107 History   First MD Initiated Contact with Patient 11/07/13 2202     Chief Complaint  Patient presents with  . Sore Throat   Martin Jones is a 33 y.o. male who presents to the Emergency Department complaining of sore throat and congestion for nearly two weeks.  He states that he has been treating with tylenol.  He states his sore throat became worse today after using drain cleaner to remove a clogged sink and he thinks he may have inhaled some of the fumes.  He states that his throat feels irritated.  He denies shortness of breath, vomiting, fever, chest pain or difficulty swallowing  (Consider location/radiation/quality/duration/timing/severity/associated sxs/prior Treatment) Patient is a 34 y.o. male presenting with pharyngitis. The history is provided by the patient.  Sore Throat This is a new problem. The current episode started 1 to 4 weeks ago. The problem occurs constantly. The problem has been unchanged. Associated symptoms include congestion, coughing and a sore throat. Pertinent negatives include no abdominal pain, arthralgias, chest pain, chills, fever, headaches, joint swelling, myalgias, nausea, neck pain, numbness, rash, swollen glands, urinary symptoms, vertigo, vomiting or weakness. The symptoms are aggravated by swallowing. He has tried acetaminophen for the symptoms. The treatment provided mild relief.    History reviewed. No pertinent past medical history. Past Surgical History  Procedure Laterality Date  . Anterior cruciate ligament repair    . Anterior cruciate ligament repair Left 2001   Family History  Problem Relation Age of Onset  . Hypertension Mother   . Hyperlipidemia Mother   . Diabetes Other    History  Substance Use Topics  . Smoking status: Current Every Day Smoker    Types: Cigarettes  . Smokeless tobacco: Not on file  . Alcohol Use: Yes    Review of Systems  Constitutional: Negative for fever,  chills, activity change and appetite change.  HENT: Positive for congestion, sneezing and sore throat. Negative for facial swelling, rhinorrhea and trouble swallowing.   Eyes: Negative for visual disturbance.  Respiratory: Positive for cough. Negative for chest tightness, shortness of breath, wheezing and stridor.   Cardiovascular: Negative for chest pain.  Gastrointestinal: Negative for nausea, vomiting and abdominal pain.  Musculoskeletal: Negative for arthralgias, joint swelling, myalgias, neck pain and neck stiffness.  Skin: Negative.  Negative for rash.  Neurological: Negative for dizziness, vertigo, weakness, numbness and headaches.  Hematological: Negative for adenopathy.  Psychiatric/Behavioral: Negative for confusion.  All other systems reviewed and are negative.     Allergies  Review of patient's allergies indicates no known allergies.  Home Medications   Prior to Admission medications   Medication Sig Start Date End Date Taking? Authorizing Provider  Alum & Mag Hydroxide-Simeth (MAGIC MOUTHWASH W/LIDOCAINE) SOLN Take 5 mLs by mouth 3 (three) times daily as needed for mouth pain. Swish and spit, do not swallow 11/07/13   Mikaelah Trostle L. Moncia Annas, PA-C  guaiFENesin-codeine (ROBITUSSIN AC) 100-10 MG/5ML syrup Take 10 mLs by mouth 3 (three) times daily as needed. 11/07/13   Terryn Rosenkranz L. Bryonna Sundby, PA-C   BP 127/80  Pulse 60  Temp(Src) 98.9 F (37.2 C) (Oral)  Resp 20  SpO2 97% Physical Exam  Nursing note and vitals reviewed. Constitutional: He is oriented to person, place, and time. He appears well-developed and well-nourished. No distress.  HENT:  Head: Normocephalic and atraumatic.  Right Ear: Tympanic membrane and ear canal normal.  Left Ear: Tympanic membrane and ear canal  normal.  Mouth/Throat: Uvula is midline and mucous membranes are normal. No trismus in the jaw. No uvula swelling. Posterior oropharyngeal erythema present. No oropharyngeal exudate, posterior oropharyngeal  edema or tonsillar abscesses.  Eyes: Conjunctivae are normal. Pupils are equal, round, and reactive to light.  Neck: Normal range of motion. Neck supple.  Cardiovascular: Normal rate, regular rhythm, normal heart sounds and intact distal pulses.   No murmur heard. Pulmonary/Chest: Effort normal and breath sounds normal. No respiratory distress. He has no wheezes. He has no rales. He exhibits no tenderness.  Abdominal: Soft. He exhibits no distension. There is no splenomegaly. There is no tenderness. There is no rebound and no guarding.  Musculoskeletal: Normal range of motion.  Lymphadenopathy:    He has no cervical adenopathy.  Neurological: He is alert and oriented to person, place, and time. He exhibits normal muscle tone. Coordination normal.  Skin: Skin is warm and dry. No rash noted.  Psychiatric: He has a normal mood and affect.    ED Course  Procedures (including critical care time) Labs Review Labs Reviewed - No data to display  Imaging Review No results found.   EKG Interpretation None      MDM   Final diagnoses:  Pharyngitis    Pt is well appearing, lungs clear, non-toxic appearing.  No PTA or cervical lymphadenopathy.  Pt agrees to symptomatic tx with magic mouthwash and robitussin AC for cough.  He agrees to close f/u or to return here if sx's worsen.  He appears stable for d/c    Mike Hamre L. Trisha Mangle, PA-C 11/09/13 1436

## 2013-11-10 NOTE — ED Provider Notes (Signed)
Medical screening examination/treatment/procedure(s) were performed by non-physician practitioner and as supervising physician I was immediately available for consultation/collaboration.   EKG Interpretation None        Benny Lennert, MD 11/10/13 [phone]

## 2014-02-05 ENCOUNTER — Encounter (HOSPITAL_COMMUNITY): Payer: Self-pay

## 2014-02-05 ENCOUNTER — Emergency Department (HOSPITAL_COMMUNITY): Payer: Managed Care, Other (non HMO)

## 2014-02-05 ENCOUNTER — Emergency Department (HOSPITAL_COMMUNITY)
Admission: EM | Admit: 2014-02-05 | Discharge: 2014-02-05 | Disposition: A | Discharge status: Home / Self Care | Payer: Managed Care, Other (non HMO) | Attending: Emergency Medicine | Admitting: Emergency Medicine

## 2014-02-05 DIAGNOSIS — W228XXA Striking against or struck by other objects, initial encounter: Secondary | ICD-10-CM | POA: Insufficient documentation

## 2014-02-05 DIAGNOSIS — Z720 Tobacco use: Secondary | ICD-10-CM | POA: Insufficient documentation

## 2014-02-05 DIAGNOSIS — Y9289 Other specified places as the place of occurrence of the external cause: Secondary | ICD-10-CM | POA: Insufficient documentation

## 2014-02-05 DIAGNOSIS — S99922A Unspecified injury of left foot, initial encounter: Secondary | ICD-10-CM | POA: Diagnosis present

## 2014-02-05 DIAGNOSIS — Y998 Other external cause status: Secondary | ICD-10-CM | POA: Diagnosis not present

## 2014-02-05 DIAGNOSIS — Y9301 Activity, walking, marching and hiking: Secondary | ICD-10-CM | POA: Diagnosis not present

## 2014-02-05 DIAGNOSIS — Y9389 Activity, other specified: Secondary | ICD-10-CM | POA: Insufficient documentation

## 2014-02-05 DIAGNOSIS — S92424A Nondisplaced fracture of distal phalanx of right great toe, initial encounter for closed fracture: Secondary | ICD-10-CM | POA: Insufficient documentation

## 2014-02-05 DIAGNOSIS — T1490XA Injury, unspecified, initial encounter: Secondary | ICD-10-CM

## 2014-02-05 DIAGNOSIS — S92912A Unspecified fracture of left toe(s), initial encounter for closed fracture: Secondary | ICD-10-CM

## 2014-02-05 MED ORDER — IBUPROFEN 600 MG PO TABS: 600.0000 mg | ORAL_TABLET | Freq: Four times a day (QID) | ORAL | Status: DC | PRN

## 2014-02-05 MED ORDER — HYDROCODONE-ACETAMINOPHEN 5-325 MG PO TABS: 1.0000 | ORAL_TABLET | ORAL | Status: DC | PRN

## 2014-02-05 NOTE — Discharge Instructions (Signed)
Buddy Taping of Toes °We have taped your toes together to keep them from moving. This is called "buddy taping" since we used a part of your own body to keep the injured part still. We placed soft padding between your toes to keep them from rubbing against each other. Buddy taping will help with healing and to reduce pain. Keep your toes buddy taped together for as long as directed by your caregiver. °HOME CARE INSTRUCTIONS  °· Raise your injured area above the level of your heart while sitting or lying down. Prop it up with pillows. °· An ice pack used every twenty minutes, while awake, for the first one to two days may be helpful. Put ice in a plastic bag and put a towel between the bag and your skin. °· Watch for signs that the taping is too tight. These signs may be: °· Numbness of your taped toes. °· Coolness of your taped toes. °· Color change in the area beyond the tape. °· Increased pain. °· If you have any of these signs, loosen or rewrap the tape. If you need to loosen or rewrap the buddy tape, make sure you use the padding again. °SEEK IMMEDIATE MEDICAL CARE IF:  °· You have worse pain, swelling, inflammation (soreness), drainage or bleeding after you rewrap the tape. °· Any new problems occur. °MAKE SURE YOU:  °· Understand these instructions. °· Will watch your condition. °· Will get help right away if you are not doing well or get worse. °Document Released: 12/05/2003 Document Revised: 05/25/2011 Document Reviewed: 02/28/2008 °ExitCare® Patient Information ©2015 [employer]. This information is not intended to replace advice given to you by your health care provider. Make sure you discuss any questions you have with your health care provider. ° °Toe Fracture °Your caregiver has diagnosed you as having a fractured toe. A toe fracture is a break in the bone of a toe. "Buddy taping" is a way of splinting your broken toe, by taping the broken toe to the toe next to it. This "buddy taping" will keep the  injured toe from moving beyond normal range of motion. Buddy taping also helps the toe heal in a more normal alignment. It may take 6 to 8 weeks for the toe injury to heal. °HOME CARE INSTRUCTIONS  °· Leave your toes taped together for as long as directed by your caregiver or until you see a doctor for a follow-up examination. You can change the tape after bathing. Always use a small piece of gauze or cotton between the toes when taping them together. This will help the skin stay dry and prevent infection. °· Apply ice to the injury for 15-20 minutes each hour while awake for the first 2 days. Put the ice in a plastic bag and place a towel between the bag of ice and your skin. °· After the first 2 days, apply heat to the injured area. Use heat for the next 2 to 3 days. Place a heating pad on the foot or soak the foot in warm water as directed by your caregiver. °· Keep your foot elevated as much as possible to lessen swelling. °· Wear sturdy, supportive shoes. The shoes should not pinch the toes or fit tightly against the toes. °· Your caregiver may prescribe a rigid shoe if your foot is very swollen. °· Your may be given crutches if the pain is too great and it hurts too much to walk. °· Only take over-the-counter or prescription medicines for pain, discomfort,   or fever as directed by your caregiver.  If your caregiver has given you a follow-up appointment, it is very important to keep that appointment. Not keeping the appointment could result in a chronic or permanent injury, pain, and disability. If there is any problem keeping the appointment, you must call back to this facility for assistance. SEEK MEDICAL CARE IF:   You have increased pain or swelling, not relieved with medications.  The pain does not get better after 1 week.  Your injured toe is cold when the others are warm. SEEK IMMEDIATE MEDICAL CARE IF:   The toe becomes cold, numb, or white.  The toe becomes hot (inflamed) and  red. Document Released: 02/28/2000 Document Revised: 05/25/2011 Document Reviewed: 10/17/2007 [facility] Patient Information 2015 [place], [place]. This information is not intended to replace advice given to you by your health care provider. Make sure you discuss any questions you have with your health care provider.  You may take the hydrocodone prescribed for pain relief.  This will make you drowsy - do not drive within 4 hours of taking this medication.

## 2014-02-05 NOTE — ED Provider Notes (Signed)
CSN: [id number]     Arrival date & time 02/05/14  2105 History   First MD Initiated Contact with Patient 02/05/14 2204     Chief Complaint  Patient presents with  . Foot Injury     (Consider location/radiation/quality/duration/timing/severity/associated sxs/prior Treatment) The history is provided by the patient.   Martin Jones is a 33 y.o. male present with right great toe and foot pain after striking his foot on a metal bar while walking last night.  His pain is tolerable at rest, worsened with walking and weight bearing.  He has taken no medications and has found no alleviators for symptoms except for rest.  He reports a history of fracture to his right 1st metatarsal occuring several years ago which does continue to cause fairly frequent pain.  He was not followed by an orthopedist for this previous injury.     History reviewed. No pertinent past medical history. Past Surgical History  Procedure Laterality Date  . Anterior cruciate ligament repair    . Anterior cruciate ligament repair Left 2001   Family History  Problem Relation Age of Onset  . Hypertension Mother   . Hyperlipidemia Mother   . Diabetes Other    History  Substance Use Topics  . Smoking status: Current Every Day Smoker -- 0.50 packs/day    Types: Cigarettes  . Smokeless tobacco: Not on file  . Alcohol Use: Yes     Comment: occasional    Review of Systems  Constitutional: Negative for fever.  Musculoskeletal: Positive for joint swelling and arthralgias. Negative for myalgias.  Neurological: Negative for weakness and numbness.      Allergies  Review of patient's allergies indicates no known allergies.  Home Medications   Prior to Admission medications   Medication Sig Start Date End Date Taking? Authorizing Provider  Alum & Mag Hydroxide-Simeth (MAGIC MOUTHWASH W/LIDOCAINE) SOLN Take 5 mLs by mouth 3 (three) times daily as needed for mouth pain. Swish and spit, do not swallow Patient not  taking: Reported on 02/05/2014 11/07/13   Tammy L. Triplett, PA-C  guaiFENesin-codeine (ROBITUSSIN AC) 100-10 MG/5ML syrup Take 10 mLs by mouth 3 (three) times daily as needed. Patient not taking: Reported on 02/05/2014 11/07/13   Tammy L. Triplett, PA-C  HYDROcodone-acetaminophen (NORCO/VICODIN) 5-325 MG per tablet Take 1 tablet by mouth every 4 (four) hours as needed. 02/05/14   Burgess AmorJulie Amoree Newlon, PA-C  ibuprofen (ADVIL,MOTRIN) 600 MG tablet Take 1 tablet (600 mg total) by mouth every 6 (six) hours as needed. 02/05/14   Burgess AmorJulie Danta Baumgardner, PA-C   BP 132/79 mmHg  Pulse 66  Temp(Src) 98.6 F (37 C) (Oral)  Resp 20  Ht 5\' 8"  (1.727 m)  Wt 200 lb (90.719 kg)  BMI 30.42 kg/m2  SpO2 100% Physical Exam  Constitutional: He appears well-developed and well-nourished.  HENT:  Head: Atraumatic.  Neck: Normal range of motion.  Cardiovascular:  Pulses equal bilaterally  Musculoskeletal: He exhibits tenderness.  ttp right great toe and corresponding distal metatarsal.  Mild edema with erythema. Skin intact.  Less than 2 sec distal cap refill.  Sensation intact.  No deformity.  Neurological: He is alert. He has normal strength. He displays normal reflexes. No sensory deficit.  Skin: Skin is warm and dry.  Psychiatric: He has a normal mood and affect.    ED Course  Procedures (including critical care time) Labs Review Labs Reviewed - No data to display  Imaging Review Dg Foot Complete Right  02/05/2014   CLINICAL DATA:  Foot injury on metal bar with first toe pain, initial encounter  EXAM: RIGHT FOOT COMPLETE - 3+ VIEW  COMPARISON:  By report from 03/24/2010  FINDINGS: . well corticated bony density is again noted in the medial articular surface of the proximal phalanx distally. Is likely related to prior trauma or unfused ossification center. The linear undisplaced lucency is noted in the first distal phalanx consistent with an undisplaced fracture. No other fracture is noted.  IMPRESSION: Chronic changes in  the first proximal phalanx.  Acute undisplaced fracture of the first distal phalanx. No other focal abnormality is noted.   Electronically Signed   By: Alcide CleverMark  Lukens M.D.   On: 02/05/2014 22:07     EKG Interpretation None      MDM   Final diagnoses:  Toe fracture, left, closed, initial encounter    Patients labs and/or radiological studies were viewed and considered during the medical decision making and disposition process. Pt was placed in buddy taping, post op shoe.  Advised to f/u with ortho, referral given.  Prescribed hydrocodone, ibuprofen.    Burgess AmorJulie Kullen Tomasetti, PA-C 02/06/14 1343  Vanetta MuldersScott Zackowski, MD 02/12/14 1949

## 2014-02-05 NOTE — ED Notes (Signed)
Patient states he hit his right foot on a metal bar last night. Patient c/o pain and swelling to right foot. Patient ambulatory into ED.

## 2014-02-06 ENCOUNTER — Telehealth: Payer: Self-pay | Admitting: Orthopedic Surgery

## 2014-02-06 NOTE — Telephone Encounter (Signed)
Noted patient is scheduled for Monday 11/30 at 9:00 am

## 2014-02-06 NOTE — Telephone Encounter (Signed)
DR HARRISON WHERE DO WE PUT THIS GENTLEMAN ER F/U RIGHT FOOT FX DOI 02/04/14 PLEASE ADVISE?

## 2014-02-06 NOTE — Telephone Encounter (Signed)
Follow regular er procedures

## 2014-02-12 ENCOUNTER — Encounter: Payer: Self-pay | Admitting: Orthopedic Surgery

## 2014-02-12 ENCOUNTER — Ambulatory Visit (INDEPENDENT_AMBULATORY_CARE_PROVIDER_SITE_OTHER): Payer: Managed Care, Other (non HMO) | Admitting: Orthopedic Surgery

## 2014-02-12 VITALS — BP 103/68 | Ht 68.0 in | Wt 200.0 lb

## 2014-02-12 DIAGNOSIS — S93529A Sprain of metatarsophalangeal joint of unspecified toe(s), initial encounter: Secondary | ICD-10-CM

## 2014-02-12 NOTE — Progress Notes (Signed)
Subjective:     Patient ID: Martin Jones, male   DOB: 11/24/1980, 33 y.o.   MRN: [id number]  Chief Complaint  Patient presents with  . Foot Injury    old fracture fracture of the Right great toe first proximal phalanx, DOI 02/04/14    Foot Injury    33-year-old male presents with pain over the right great toe at the MTP joint after he hit his foot up against a metal bar. He was on 02/04/2014 he went to the emergency room he was evaluated there with x-rays which showed an old right great toe IP joint proximal phalanx fracture at the joint line which has not bothered him and was an old injury. He complains of pain and swelling at the MTP joint with aching 8 out of 10 pain partially relieved with ibuprofen and hydrocodone and postop shoe   Review of Systems He reports his review of systems as fatigue sinus problems cough  No past medical history on file.  Past Surgical History  Procedure Laterality Date  . Anterior cruciate ligament repair    . Anterior cruciate ligament repair Left 2001       Objective:   Physical Exam VS BP 103/68 mmHg  Ht 5\' 8"  (1.727 m)  Wt 200 lb (90.719 kg)  BMI 30.42 kg/m2  Gen. appearance is normal The patient is alert and oriented person place and time Mood is normal affect is normal Ambulatory status abnormal, slight limp with the postop shoe  Evaluation of the right foot in standing position compared to the left there is a slight valgus at the MTP joint there are calluses under the MTP joint bilaterally and at the IP joints medially and plantar.  The right great toe MTP joint is tender and swollen and painful with flexion extension. There is some mild tenderness at the IP joint as well.  No instability was detected however and the range of motion was full. Skin was intact with calluses as noted there is good dorsalis pedis pulse and good capillary refill and there was normal sensation.      Assessment:     Imaging studies show an old fracture at  the distal aspect of the proximal phalanx at the IP joint of the right great toe sclerotic bone cyst formation is noted there.  We also see a non-fused sesamoid bone which is normal.  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

## 2014-02-12 NOTE — Patient Instructions (Addendum)
Wear shoe  Take medcation x 3 weeks  Out of work x 3 weeks

## 2014-03-05 ENCOUNTER — Encounter: Payer: Self-pay | Admitting: Orthopedic Surgery

## 2014-03-05 ENCOUNTER — Ambulatory Visit (INDEPENDENT_AMBULATORY_CARE_PROVIDER_SITE_OTHER): Payer: Managed Care, Other (non HMO) | Admitting: Orthopedic Surgery

## 2014-03-05 VITALS — BP 111/70 | Ht 68.0 in | Wt 200.0 lb

## 2014-03-05 DIAGNOSIS — S93529D Sprain of metatarsophalangeal joint of unspecified toe(s), subsequent encounter: Secondary | ICD-10-CM

## 2014-03-05 NOTE — Progress Notes (Signed)
Follow-up visit  Encounter Diagnosis  Name Primary?  . Turf toe, subsequent encounter Yes    Chief Complaint  Patient presents with  . Follow-up    3 week recheck on right great toe, DOI 02-04-14.     The patient still has some soreness over the MTP joint however he is returned to normal shoe wear   no numbness or tingling the area of the fractures nontender has never really been tender. Exam shows his ambulatory status is normal has palpable tenderness around the MTP joint pain with hyperextension of the great toe at the MTP joint slight decrease in overall range of motion at the MTP joint without instability. Skin is intact pulses are good color is normal and sensation is normal  Impression turf toe improved   Continue hard sole shoe return to work soak foot absent salt 20 minutes once a day until resolved follow-up when necessary

## 2014-03-05 NOTE — Patient Instructions (Signed)
Return to work tomorrow  Soak foot in epsom salt once a day for 20 min

## 2014-12-26 ENCOUNTER — Emergency Department
Admission: EM | Admit: 2014-12-26 | Discharge: 2014-12-26 | Discharge status: Against Medical Advice | Payer: Managed Care, Other (non HMO) | Attending: Emergency Medicine | Admitting: Emergency Medicine

## 2014-12-26 ENCOUNTER — Emergency Department: Payer: Managed Care, Other (non HMO)

## 2014-12-26 ENCOUNTER — Encounter: Payer: Self-pay | Admitting: *Deleted

## 2014-12-26 DIAGNOSIS — M25461 Effusion, right knee: Secondary | ICD-10-CM | POA: Insufficient documentation

## 2014-12-26 DIAGNOSIS — M25561 Pain in right knee: Secondary | ICD-10-CM | POA: Insufficient documentation

## 2014-12-26 DIAGNOSIS — R51 Headache: Secondary | ICD-10-CM | POA: Diagnosis not present

## 2014-12-26 DIAGNOSIS — R509 Fever, unspecified: Secondary | ICD-10-CM | POA: Insufficient documentation

## 2014-12-26 DIAGNOSIS — Z720 Tobacco use: Secondary | ICD-10-CM | POA: Insufficient documentation

## 2014-12-26 MED ORDER — ACETAMINOPHEN 325 MG PO TABS
650.0000 mg | ORAL_TABLET | Freq: Once | ORAL | Status: AC
  Administered 2014-12-26: 650 mg via ORAL

## 2014-12-26 NOTE — ED Notes (Addendum)
Pt has a frontal headache. Sx for 1 day. No otc meds today.  Pt alert.  Speech clear.  Pt also has swelling and pain to right knee.  No known injury.  Pt also reports fever today.  Pt reports no cough, pain, v/d.

## 2016-03-29 IMAGING — CR DG KNEE COMPLETE 4+V*R*
4 series · 4 of 4 positions shown · non-contrast
Comparison: None.

CLINICAL DATA: Subacute right knee pain and swelling. No known
injury.

EXAM:
RIGHT KNEE - COMPLETE 4+ VIEW

[knee ap]
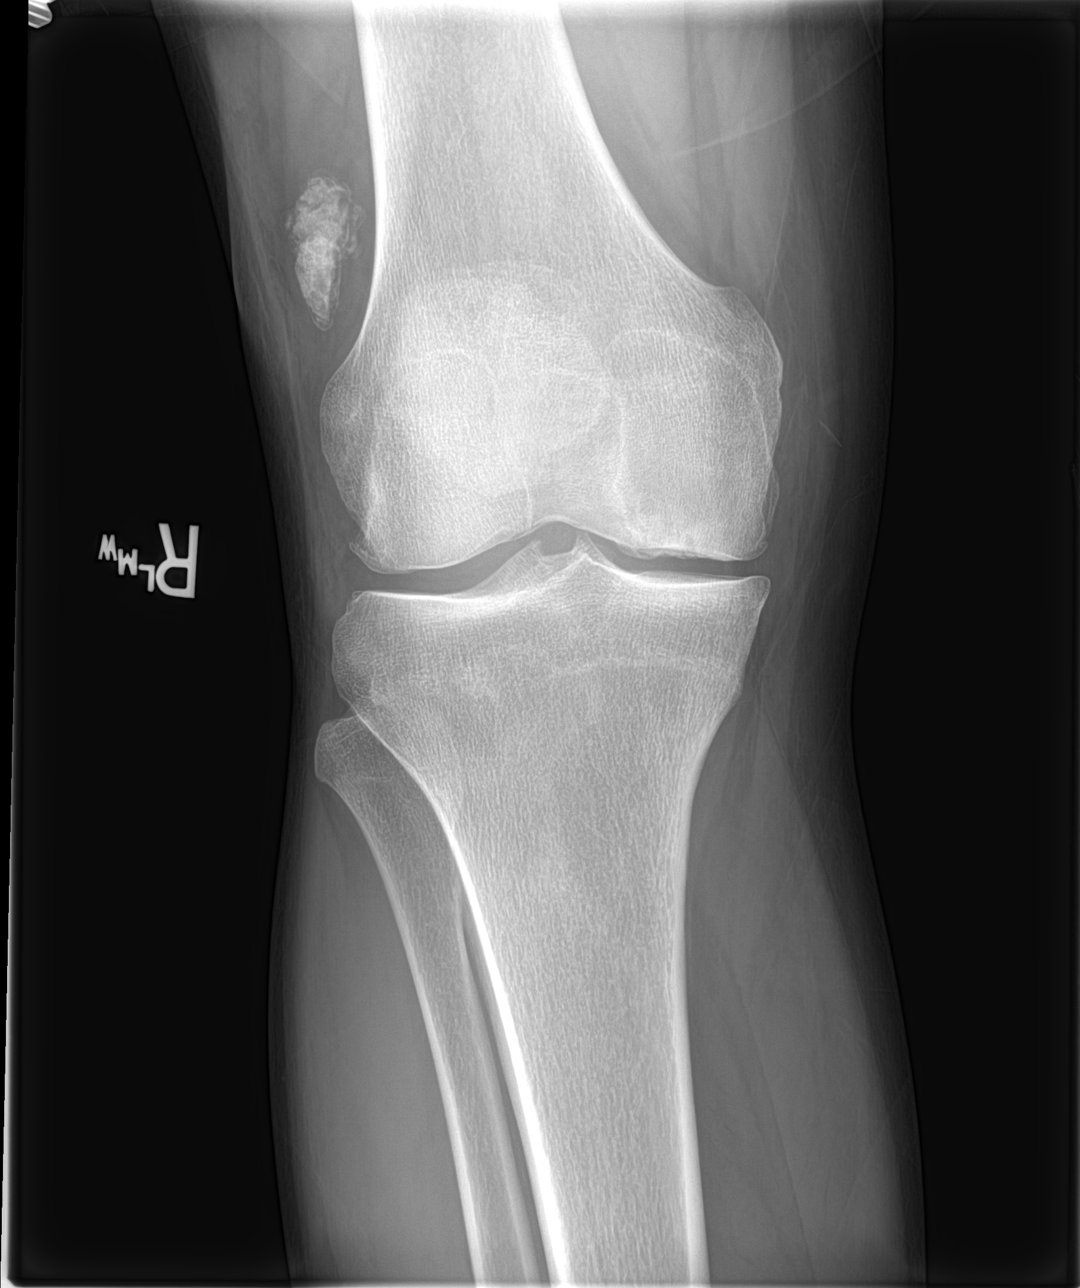

[knee obl (1 of 2)]
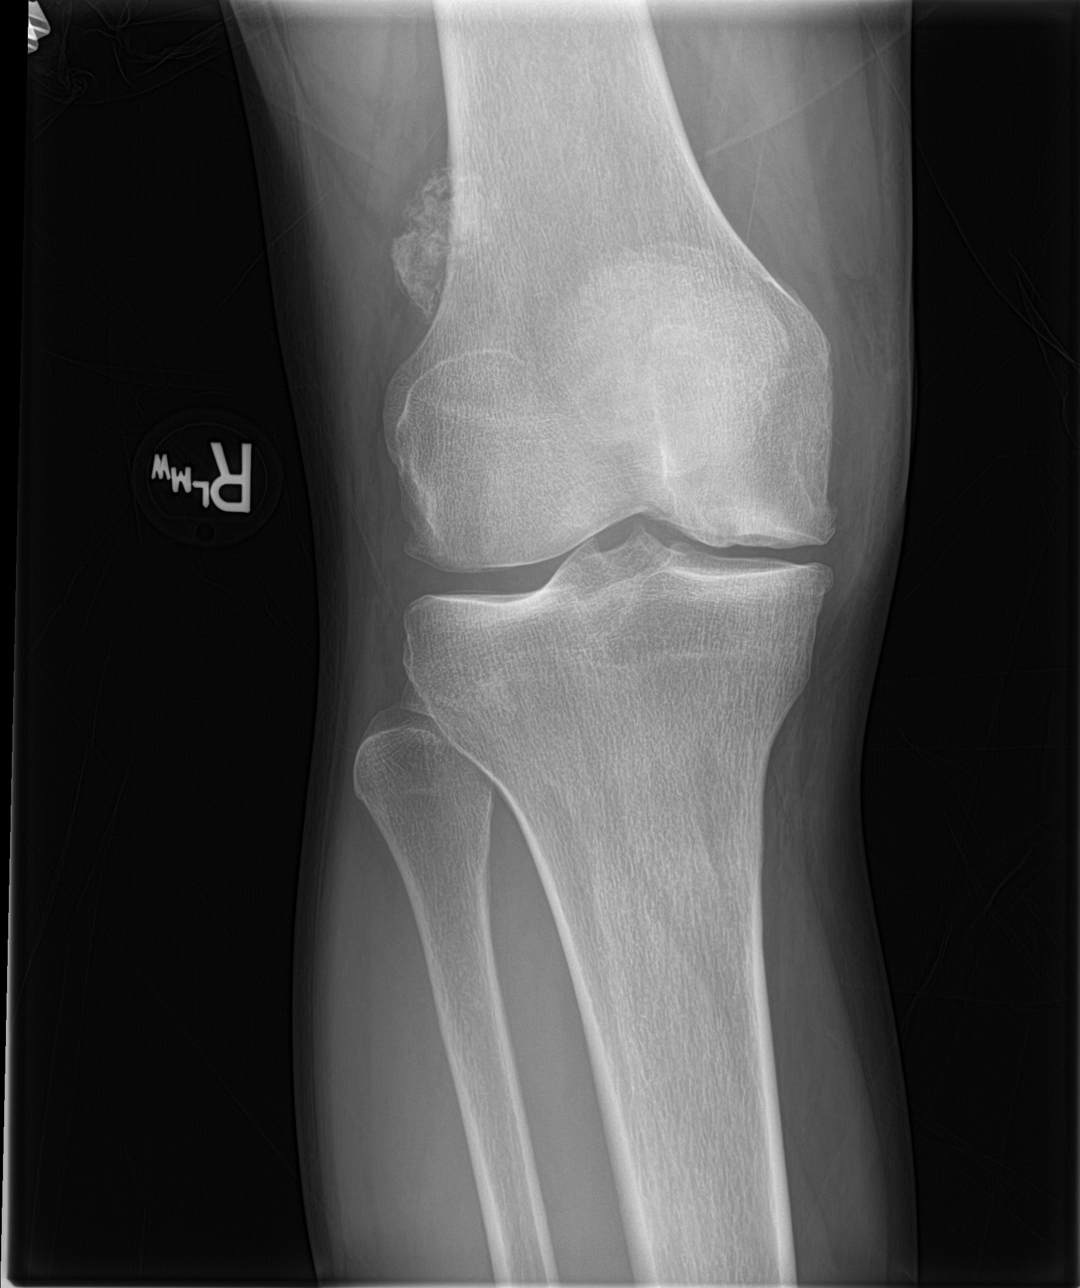

[knee obl (2 of 2)]
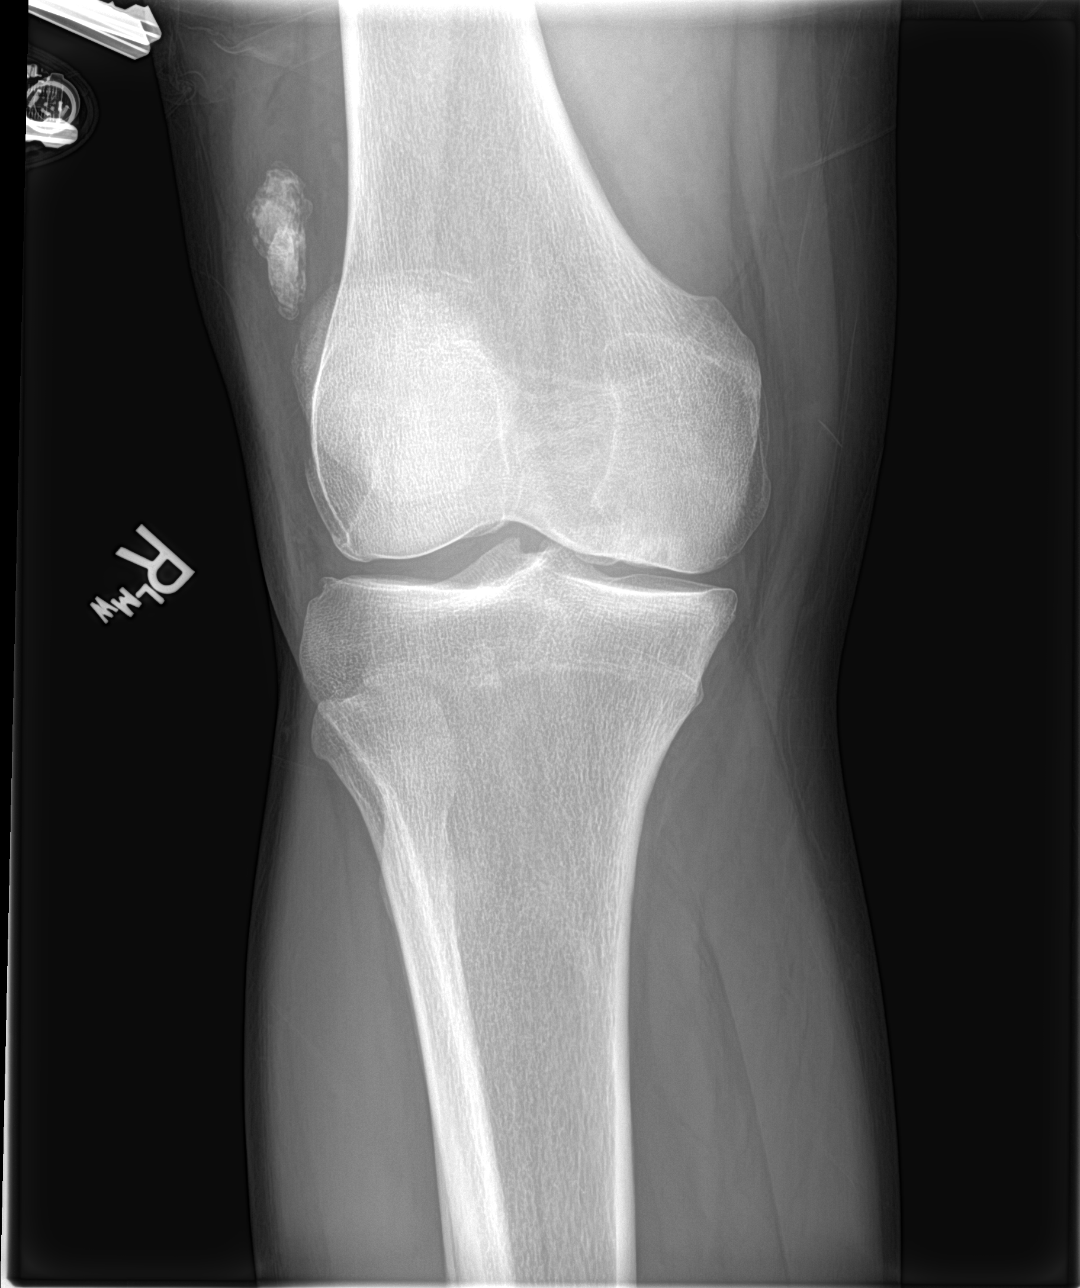

[knee lat]
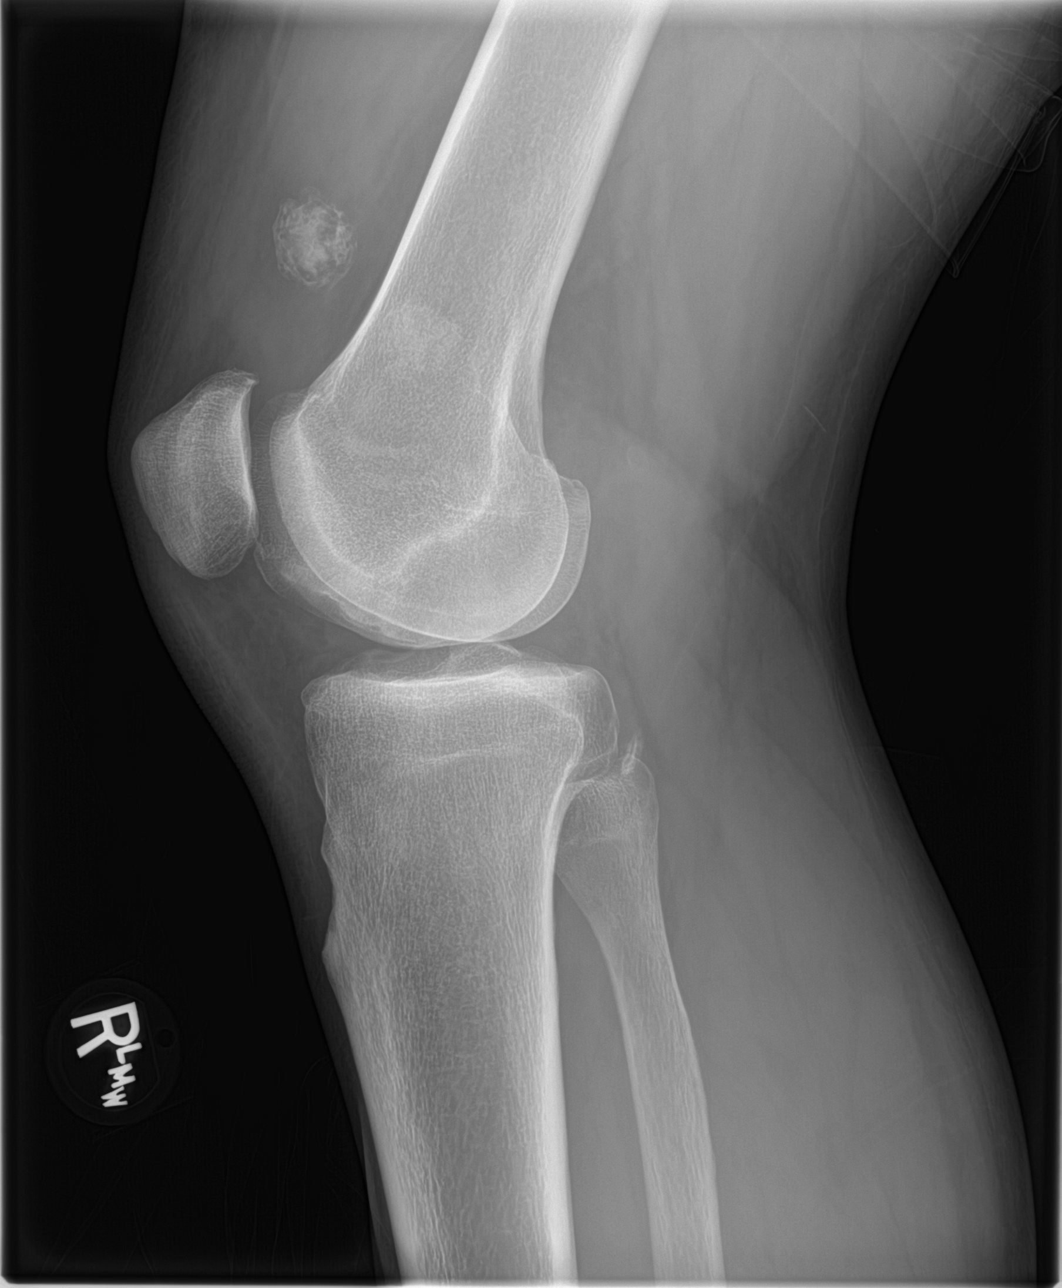

[4 of 4 positions shown; findings below may reference images not displayed]

FINDINGS: A 2 cm calcification in the region of the suprapatellar bursa is
noted compatible with loose body/synovial osteochondromatosis.

A small to moderate knee effusion is present.

Tricompartmental degenerative changes are noted, moderate in the
medial compartment.

No acute fracture, subluxation or dislocation identified.
IMPRESSION: 2 cm loose body in the suprapatellar bursa with small to moderate
knee effusion. This may represent calcific bursitis/ synovial
osteochondromatosis.

Tricompartmental degenerative changes.

No evidence of acute bony abnormality.

## 2016-11-15 ENCOUNTER — Emergency Department
Admission: EM | Admit: 2016-11-15 | Discharge: 2016-11-16 | Disposition: A | Discharge status: Home / Self Care | Payer: BLUE CROSS/BLUE SHIELD | Attending: Emergency Medicine | Admitting: Emergency Medicine

## 2016-11-15 DIAGNOSIS — Y9289 Other specified places as the place of occurrence of the external cause: Secondary | ICD-10-CM | POA: Insufficient documentation

## 2016-11-15 DIAGNOSIS — Z79899 Other long term (current) drug therapy: Secondary | ICD-10-CM | POA: Insufficient documentation

## 2016-11-15 DIAGNOSIS — X501XXA Overexertion from prolonged static or awkward postures, initial encounter: Secondary | ICD-10-CM | POA: Insufficient documentation

## 2016-11-15 DIAGNOSIS — Y999 Unspecified external cause status: Secondary | ICD-10-CM | POA: Insufficient documentation

## 2016-11-15 DIAGNOSIS — Y939 Activity, unspecified: Secondary | ICD-10-CM | POA: Insufficient documentation

## 2016-11-15 DIAGNOSIS — M62830 Muscle spasm of back: Secondary | ICD-10-CM | POA: Insufficient documentation

## 2016-11-15 DIAGNOSIS — S39012A Strain of muscle, fascia and tendon of lower back, initial encounter: Secondary | ICD-10-CM | POA: Diagnosis not present

## 2016-11-15 DIAGNOSIS — F17210 Nicotine dependence, cigarettes, uncomplicated: Secondary | ICD-10-CM | POA: Insufficient documentation

## 2016-11-15 DIAGNOSIS — S3992XA Unspecified injury of lower back, initial encounter: Secondary | ICD-10-CM | POA: Diagnosis present

## 2016-11-15 MED ORDER — PREDNISONE 10 MG (21) PO TBPK: ORAL_TABLET | ORAL | 0 refills | Status: DC

## 2016-11-15 MED ORDER — DIAZEPAM 5 MG PO TABS: 5.0000 mg | ORAL_TABLET | Freq: Once | ORAL | Status: DC

## 2016-11-15 MED ORDER — AMLODIPINE BESYLATE 5 MG PO TABS
ORAL_TABLET | ORAL | Status: AC
  Filled 2016-11-15: qty 1

## 2016-11-15 MED ORDER — CYCLOBENZAPRINE HCL 10 MG PO TABS: 10.0000 mg | ORAL_TABLET | Freq: Three times a day (TID) | ORAL | 0 refills | Status: DC | PRN

## 2016-11-15 MED ORDER — ORPHENADRINE CITRATE 30 MG/ML IJ SOLN
60.0000 mg | Freq: Two times a day (BID) | INTRAMUSCULAR | Status: DC
  Filled 2016-11-15: qty 2

## 2016-11-15 MED ORDER — DEXAMETHASONE SODIUM PHOSPHATE 10 MG/ML IJ SOLN
10.0000 mg | Freq: Once | INTRAMUSCULAR | Status: AC
  Administered 2016-11-15: 10 mg via INTRAMUSCULAR
  Filled 2016-11-15: qty 1

## 2016-11-15 MED ORDER — ORPHENADRINE CITRATE 30 MG/ML IJ SOLN
60.0000 mg | Freq: Two times a day (BID) | INTRAMUSCULAR | Status: DC
  Administered 2016-11-15: 60 mg via INTRAMUSCULAR

## 2016-11-15 NOTE — ED Provider Notes (Signed)
[facility] Emergency Department Provider Note   ____________________________________________   I have reviewed the triage vital signs and the nursing notes.   HISTORY  Chief Complaint Back Pain    HPI Martin Jones is a 36 y.o. male presents to the emergency department with lower back pain that began this past Monday area patient reports sitting in his car during a break at work when he turned to get out of his car he noted onset of central lumbar back pain. Patient self treated with activity modification, NSAIDs and being treated by a chiropractor without improvement of symptoms. Patient reports remote lumbar strain with resolution of symptoms. Patient denies any recent injury associated with the above symptoms. Patient works on an Theatre stage manager at [employer] however, he denies any activity that would contribute to current symptoms. Patient reports central lumbar pain that is constant, aching with intermittent sharp pains that radiate down the left leg and up the left side of his back. Patient denies bowel or bladder dysfunction or saddle anesthesia. Patient reports increased symptoms with spine rotation, for flexion and sidebending movements. Patient denies fever, chills, headache, vision changes, chest pain, chest tightness, shortness of breath, abdominal pain, nausea and vomiting.  No past medical history on file.  Patient Active Problem List   Diagnosis Date Noted  . Turf toe 02/12/2014  . Left shoulder pain 04/07/2013  . Obesity, BMI unknown 10/03/2012    Past Surgical History:  Procedure Laterality Date  . ANTERIOR CRUCIATE LIGAMENT REPAIR    . ANTERIOR CRUCIATE LIGAMENT REPAIR Left 2001    Prior to Admission medications   Medication Sig Start Date End Date Taking? Authorizing Provider  Alum & Mag Hydroxide-Simeth (MAGIC MOUTHWASH W/LIDOCAINE) SOLN Take 5 mLs by mouth 3 (three) times daily as needed for mouth pain. Swish and spit, do not  swallow Patient not taking: Reported on 02/05/2014 11/07/13   Triplett, Tammy, PA-C  cyclobenzaprine (FLEXERIL) 10 MG tablet Take 1 tablet (10 mg total) by mouth 3 (three) times daily as needed for muscle spasms. 11/15/16   Dayle Mcnerney M, PA-C  guaiFENesin-codeine (ROBITUSSIN AC) 100-10 MG/5ML syrup Take 10 mLs by mouth 3 (three) times daily as needed. Patient not taking: Reported on 02/05/2014 11/07/13   Pauline Aus, PA-C  HYDROcodone-acetaminophen (NORCO/VICODIN) 5-325 MG per tablet Take 1 tablet by mouth every 4 (four) hours as needed. 02/05/14   Burgess Amor, PA-C  ibuprofen (ADVIL,MOTRIN) 600 MG tablet Take 1 tablet (600 mg total) by mouth every 6 (six) hours as needed. 02/05/14   Idol, Raynelle Fanning, PA-C  predniSONE (STERAPRED UNI-PAK 21 TAB) 10 MG (21) TBPK tablet Take 6 tablets on day 1. Take 5 tablets on day 2. Take 4 tablets on day 3. Take 3 tablets on day 4. Take 2 tablets on day 5. Take 1 tablets on day 6. 11/15/16   Deisha Stull M, PA-C    Allergies Patient has no known allergies.  Family History  Problem Relation Age of Onset  . Hypertension Mother   . Hyperlipidemia Mother   . Diabetes Other     Social History Social History  Substance Use Topics  . Smoking status: Current Every Day Smoker    Packs/day: 0.50    Types: Cigarettes  . Smokeless tobacco: Not on file  . Alcohol use Yes     Comment: occasional    Review of Systems Constitutional: Negative for fever/chills Eyes: No visual changes. ENT:  Negative for sore throat and for difficulty swallowing Cardiovascular:  Denies chest pain. Respiratory: Denies cough. Denies shortness of breath. Gastrointestinal: No abdominal pain.  No nausea, vomiting, diarrhea. Genitourinary: Negative for dysuria. Musculoskeletal: Positive for lumbar back pain Skin: Negative for rash. Neurological: Negative for headaches.  Negative focal weakness or numbness. Able to ambulate. ____________________________________________   PHYSICAL  EXAM:  VITAL SIGNS: ED Triage Vitals  Enc Vitals Group     BP 11/15/16 2231 (!) 146/69     Pulse Rate 11/15/16 2231 81     Resp 11/15/16 2231 18     Temp 11/15/16 2231 98.7 F (37.1 C)     Temp Source 11/15/16 2231 Oral     SpO2 11/15/16 2231 99 %     Weight 11/15/16 2232 205 lb (93 kg)     Height 11/15/16 2232 5\' 9"  (1.753 m)     Head Circumference --      Peak Flow --      Pain Score 11/15/16 2231 10     Pain Loc --      Pain Edu? --      Excl. in GC? --     Constitutional: Alert and oriented. Well appearing and in no acute distress.  Eyes: Conjunctivae are normal. PERRL. EOMI  Head: Normocephalic and atraumatic. ENT:      Ears: Canals clear. TMs intact bilaterally.      Nose: No congestion/rhinnorhea.      Mouth/Throat: Mucous membranes are moist. Neck:Supple. No thyromegaly. No stridor.  Cardiovascular: Normal rate, regular rhythm. Normal S1 and S2.  Good peripheral circulation. Respiratory: Normal respiratory effort without tachypnea or retractions. Lungs CTAB. No wheezes/rales/rhonchi. Hematological/Lymphatic/Immunological: No cervical lymphadenopathy. Cardiovascular: Normal rate, regular rhythm. Normal distal pulses. Gastrointestinal: Bowel sounds 4 quadrants. Soft and nontender to palpation. No CVA tenderness. Musculoskeletal: Lumbar back pain localized to the spinous processes of L4-S1. Palpable tenderness along lumbar multifidus and gluten medius. Intact lumbar spine range of motion without deficit. Negative radiculopathy, negative bowel or bladder dysfunction and negative saddle anesthesia. Negative palatal tenderness along the lumbar spinous process. Neurologic: Normal speech and language. No gross focal neurologic deficits are appreciated. No gait instability.  Skin:  Skin is warm, dry and intact. No rash noted. Psychiatric: Mood and affect are normal. Speech and behavior are normal. Patient exhibits appropriate insight and  judgement.  ____________________________________________   LABS (all labs ordered are listed, but only abnormal results are displayed)  Labs Reviewed - No data to display ____________________________________________  EKG None ____________________________________________  RADIOLOGY None ____________________________________________   PROCEDURES  Procedure(s) performed: No    Critical Care performed: no ____________________________________________   INITIAL IMPRESSION / ASSESSMENT AND PLAN / ED COURSE  Pertinent labs & imaging results that were available during my care of the patient were reviewed by me and considered in my medical decision making (see chart for details).  Patient presents to emergency department with lumbar back pain pain. History and physical exam findings are reassuring symptoms are consistent mild lumbar strain with associated muscle spasms. Patient will be prescribed prednisone taper and Flexeril as needed for muscle spasms. Patient advised to follow up with PCP as needed or return to the emergency department if symptoms return or worsen. Patient informed of clinical course, understand medical decision-making process, and agree with plan.  _______________________________________   FINAL CLINICAL IMPRESSION(S) / ED DIAGNOSES  Final diagnoses:  Strain of lumbar region, initial encounter  Muscle spasm of back       NEW MEDICATIONS STARTED DURING THIS VISIT:  New Prescriptions   CYCLOBENZAPRINE (FLEXERIL) 10  MG TABLET    Take 1 tablet (10 mg total) by mouth 3 (three) times daily as needed for muscle spasms.   PREDNISONE (STERAPRED UNI-PAK 21 TAB) 10 MG (21) TBPK TABLET    Take 6 tablets on day 1. Take 5 tablets on day 2. Take 4 tablets on day 3. Take 3 tablets on day 4. Take 2 tablets on day 5. Take 1 tablets on day 6.     Note:  This document was prepared using Dragon voice recognition software and may include unintentional dictation  errors.    Clois ComberLittle, Anyae Griffith M, PA-C 11/15/16 2325    Emily FilbertWilliams, Jonathan E, MD 11/18/16 [phone]

## 2016-11-15 NOTE — ED Triage Notes (Signed)
Pt states that Monday after sitting in the car for about 5 min when he stood up he started having lower back pain, pt reports that it will occasionally radiate upward and downward of his spine, denies any tingling or numbness or difficulty with urinating or having BM's pt ambulatory to triage, appears stiff with walking

## 2016-11-15 NOTE — Discharge Instructions (Signed)
Take medication as prescribed. Return to emergency department if symptoms worsen and follow-up with PCP as needed.    Utilize heat and/or cold therapy to manage symptoms in addition to prescribed medication.  Avoid heavy lifting, twisting, pushing or pulling for the next 1-2 days until symptoms improve.

## 2022-04-10 DIAGNOSIS — M25521 Pain in right elbow: Secondary | ICD-10-CM | POA: Diagnosis not present

## 2022-04-17 DIAGNOSIS — S46311A Strain of muscle, fascia and tendon of triceps, right arm, initial encounter: Secondary | ICD-10-CM | POA: Diagnosis not present

## 2022-04-27 DIAGNOSIS — Z113 Encounter for screening for infections with a predominantly sexual mode of transmission: Secondary | ICD-10-CM | POA: Diagnosis not present

## 2022-04-27 DIAGNOSIS — E669 Obesity, unspecified: Secondary | ICD-10-CM | POA: Diagnosis not present

## 2022-04-27 DIAGNOSIS — Z0000 Encounter for general adult medical examination without abnormal findings: Secondary | ICD-10-CM | POA: Diagnosis not present

## 2022-04-27 DIAGNOSIS — Z1330 Encounter for screening examination for mental health and behavioral disorders, unspecified: Secondary | ICD-10-CM | POA: Diagnosis not present

## 2022-04-27 DIAGNOSIS — Z131 Encounter for screening for diabetes mellitus: Secondary | ICD-10-CM | POA: Diagnosis not present

## 2022-05-18 DIAGNOSIS — S46311A Strain of muscle, fascia and tendon of triceps, right arm, initial encounter: Secondary | ICD-10-CM | POA: Diagnosis not present

## 2022-06-09 ENCOUNTER — Ambulatory Visit (INDEPENDENT_AMBULATORY_CARE_PROVIDER_SITE_OTHER): Payer: BC Managed Care – PPO | Admitting: Bariatrics

## 2022-06-09 ENCOUNTER — Encounter: Payer: Self-pay | Admitting: Bariatrics

## 2022-06-09 VITALS — BP 143/85 | HR 99 | Temp 98.0°F | Ht 69.0 in | Wt 261.0 lb

## 2022-06-09 DIAGNOSIS — E669 Obesity, unspecified: Secondary | ICD-10-CM | POA: Diagnosis not present

## 2022-06-09 DIAGNOSIS — Z6838 Body mass index (BMI) 38.0-38.9, adult: Secondary | ICD-10-CM

## 2022-06-09 DIAGNOSIS — Z0289 Encounter for other administrative examinations: Secondary | ICD-10-CM

## 2022-06-09 DIAGNOSIS — R030 Elevated blood-pressure reading, without diagnosis of hypertension: Secondary | ICD-10-CM | POA: Diagnosis not present

## 2022-06-09 DIAGNOSIS — E65 Localized adiposity: Secondary | ICD-10-CM

## 2022-06-09 NOTE — Progress Notes (Addendum)
Office: [phone]  /  Fax: [phone]   Initial Visit  Martin Jones was seen in clinic today to evaluate for obesity. He is interested in losing weight to improve overall health and reduce the risk of weight related complications. He presents today to review program treatment options, initial physical assessment, and evaluation.     He was referred by: PCP  When asked what else they would like to accomplish? He states: Adopt healthier eating patterns, Improve energy levels and physical activity, and Lose a target amount of weight : 60 to 70 lbs  When asked how has your weight affected you? He states: Contributed to orthopedic problems or mobility issues, Having fatigue, and Having poor endurance  Some associated conditions: None  Contributing factors: Family history  Weight promoting medications identified: Steroids  Current nutrition plan: None and Other: Keto and intermittent fasting.   Current level of physical activity: None  Current or previous pharmacotherapy: None  Response to medication: Never tried medications   Past medical history includes:  History reviewed. No pertinent past medical history.   Objective:   BP (!) 143/85   Pulse 99   Temp 98 F (36.7 C)   Ht 5\' 9"  (1.753 m)   Wt 261 lb (118.4 kg)   SpO2 96%   BMI 38.54 kg/m  He was weighed on the bioimpedance scale: Body mass index is 38.54 kg/m.  Peak Weight:261 , Body Fat%:34.8, Visceral Fat Rating:19, Weight trend over the last 12 months: Increasing  General:  Alert, oriented and cooperative. Patient is in no acute distress.  Respiratory: Normal respiratory effort, no problems with respiration noted  Extremities: Normal range of motion.    Mental Status: Normal mood and affect. Normal behavior. Normal judgment and thought content.   DIAGNOSTIC DATA REVIEWED:  BMET    Component Value Date/Time   NA 137 10/12/2012 0915   K 4.2 10/12/2012 0915   CL 101 10/12/2012 0915   CO2 27 10/12/2012  0915   GLUCOSE 91 10/12/2012 0915   BUN 10 10/12/2012 0915   CREATININE 1.22 10/12/2012 0915   CALCIUM 9.3 10/12/2012 0915   No results found for: "HGBA1C" No results found for: "INSULIN" CBC No results found for: "WBC", "RBC", "HGB", "HCT", "PLT", "MCV", "MCH", "MCHC", "RDW" Iron/TIBC/Ferritin/ %Sat No results found for: "IRON", "TIBC", "FERRITIN", "IRONPCTSAT" Lipid Panel     Component Value Date/Time   CHOL 163 10/12/2012 0915   TRIG 90 10/12/2012 0915   HDL 36 (L) 10/12/2012 0915   CHOLHDL 4.5 10/12/2012 0915   VLDL 18 10/12/2012 0915   LDLCALC 109 (H) 10/12/2012 0915   Hepatic Function Panel  No results found for: "PROT", "ALBUMIN", "AST", "ALT", "ALKPHOS", "BILITOT", "BILIDIR", "IBILI" No results found for: "TSH"   Assessment and Plan:   Visceral Obesity :  He has a visceral fat rating of 19 per the bio-impedence scale.   Plan:  The goal is a visceral fat rating of 13 or below.  Will work on the plan and increase exercise/begin exercise.    Elevated blood pressure reading:  He has an elevated blood pressure reading today of 143/85.  He denies a history of hypertension and is not on any medications for hypertension.  Plan: Will follow over time if he becomes a patient. He will discuss with his PCP.   Generalized Obesity  BMI = 38       Obesity Treatment / Action Plan:  Patient will work on garnering support from family and friends to begin  weight loss journey. Will work on eliminating or reducing the presence of highly palatable, calorie dense foods in the home. Will complete provided nutritional and psychosocial assessment questionnaire before the next appointment. Will be scheduled for indirect calorimetry to determine resting energy expenditure in a fasting state.  This will allow [place] to create a reduced calorie, high-protein meal plan to promote loss of fat mass while preserving muscle mass.  Obesity Education Performed Today:  He was weighed on the  bioimpedance scale and results were discussed and documented in the synopsis.  We discussed obesity as a disease and the importance of a more detailed evaluation of all the factors contributing to the disease.  We discussed the importance of long term lifestyle changes which include nutrition, exercise and behavioral modifications as well as the importance of customizing this to his specific health and social needs.  We discussed the benefits of reaching a healthier weight to alleviate the symptoms of existing conditions and reduce the risks of the biomechanical, metabolic and psychological effects of obesity.  KHAMAL JUSTINIANO appears to be in the action stage of change and states they are ready to start intensive lifestyle modifications and behavioral modifications.  30 minutes was spent today on this visit including the above counseling, pre-visit chart review, and post-visit documentation.  Reviewed by clinician on day of visit: allergies, medications, problem list, medical history, surgical history, family history, social history, and previous encounter notes.    Pearson Reasons A. Ruben ImO.

## 2022-06-16 ENCOUNTER — Encounter: Payer: Self-pay | Admitting: Bariatrics

## 2022-06-16 ENCOUNTER — Ambulatory Visit (INDEPENDENT_AMBULATORY_CARE_PROVIDER_SITE_OTHER): Payer: BC Managed Care – PPO | Admitting: Bariatrics

## 2022-06-16 VITALS — BP 122/76 | HR 63 | Temp 98.0°F | Ht 69.0 in | Wt 256.0 lb

## 2022-06-16 DIAGNOSIS — R5383 Other fatigue: Secondary | ICD-10-CM | POA: Diagnosis not present

## 2022-06-16 DIAGNOSIS — R0602 Shortness of breath: Secondary | ICD-10-CM | POA: Insufficient documentation

## 2022-06-16 DIAGNOSIS — Z1331 Encounter for screening for depression: Secondary | ICD-10-CM | POA: Diagnosis not present

## 2022-06-16 DIAGNOSIS — E781 Pure hyperglyceridemia: Secondary | ICD-10-CM | POA: Diagnosis not present

## 2022-06-16 DIAGNOSIS — E538 Deficiency of other specified B group vitamins: Secondary | ICD-10-CM | POA: Diagnosis not present

## 2022-06-16 DIAGNOSIS — E559 Vitamin D deficiency, unspecified: Secondary | ICD-10-CM | POA: Diagnosis not present

## 2022-06-16 DIAGNOSIS — Z6837 Body mass index (BMI) 37.0-37.9, adult: Secondary | ICD-10-CM

## 2022-06-16 DIAGNOSIS — E669 Obesity, unspecified: Secondary | ICD-10-CM

## 2022-06-16 DIAGNOSIS — R7303 Prediabetes: Secondary | ICD-10-CM | POA: Diagnosis not present

## 2022-06-16 DIAGNOSIS — E65 Localized adiposity: Secondary | ICD-10-CM | POA: Insufficient documentation

## 2022-06-16 DIAGNOSIS — Z0000 Encounter for general adult medical examination without abnormal findings: Secondary | ICD-10-CM

## 2022-06-17 ENCOUNTER — Encounter: Payer: Self-pay | Admitting: Bariatrics

## 2022-06-17 LAB — VITAMIN B12: Vitamin B-12: 566 pg/mL (ref 232–1245)

## 2022-06-17 LAB — INSULIN, RANDOM: INSULIN: 11.2 u[IU]/mL (ref 2.6–24.9)

## 2022-06-17 LAB — VITAMIN D 25 HYDROXY (VIT D DEFICIENCY, FRACTURES): Vit D, 25-Hydroxy: 7.5 ng/mL — ABNORMAL LOW (ref 30.0–100.0)

## 2022-06-17 NOTE — Progress Notes (Signed)
Chief Complaint:   OBESITY Martin Jones (MR# [id number]) is a 42 y.o. male who presents for evaluation and treatment of obesity and related comorbidities. Current BMI is Body mass index is 37.8 kg/m. Reason has been struggling with his weight for many years and has been unsuccessful in either losing weight, maintaining weight loss, or reaching his healthy weight goal.  Patient was seen by me for his initial information visit on 06/09/2022.  Martin Jones is currently in the action stage of change and ready to dedicate time achieving and maintaining a healthier weight. Martin Jones is interested in becoming our patient and working on intensive lifestyle modifications including (but not limited to) diet and exercise for weight loss.  Martin Jones's habits were reviewed today and are as follows: he struggles with family and or coworkers weight loss sabotage, his desired weight loss is 61 lbs, he started gaining weight about 6 months ago, his heaviest weight ever was 264 pounds, he has significant food cravings issues, he snacks frequently in the evenings, he skips meals frequently, he is trying to follow a vegan diet, he is frequently drinking liquids with calories, he frequently makes poor food choices, he frequently eats larger portions than normal, and he struggles with emotional eating.  Depression Screen Martin Jones's Food and Mood (modified PHQ-9) score was 13.  Subjective:   1. Other fatigue Martin Jones admits to daytime somnolence and admits to waking up still tired. Patient has a history of symptoms of daytime fatigue, morning fatigue, and morning headache. Martin Jones generally gets 4 hours of sleep per night, and states that he has nightime awakenings. Snoring is present. Apneic episodes are not present. Epworth Sleepiness Score is 12.   2. SOB (shortness of breath) on exertion Martin Jones notes increasing shortness of breath with exercising and seems to be worsening over time with weight gain. He notes getting  out of breath sooner with activity than he used to. This has not gotten worse recently. Martin Jones denies shortness of breath at rest or orthopnea.  3. Visceral obesity Patient's visceral fat rating is 18.  4. Vitamin D deficiency Patient is not taking any vitamin supplements.  5. Health care maintenance Obesity.  6. Hypertriglyceridemia Patient has decreased HDL, total cholesterol 231, LDL 146.  7. Prediabetes Patient's last A1c was 5.9.  Patient's mother has diabetes mellitus type 2.  8. B12 deficiency Patient is not taking any vitamin supplementation.  Assessment/Plan:   1. Other fatigue Martin Jones does feel that his weight is causing his energy to be lower than it should be. Fatigue may be related to obesity, depression or many other causes. Labs will be ordered, and in the meanwhile, Martin Jones will focus on self care including making healthy food choices, increasing physical activity and focusing on stress reduction.  - EKG 12-Lead  2. SOB (shortness of breath) on exertion Martin Jones does feel that he gets out of breath more easily that he used to when he exercises. Martin Jones's shortness of breath appears to be obesity related and exercise induced. He has agreed to work on weight loss and gradually increase exercise to treat his exercise induced shortness of breath. Will continue to monitor closely.  3. Visceral obesity We will work on diet and exercise.  Goal for visceral fat less than 13.  4. Vitamin D deficiency Check labs today.  - VITAMIN D 25 Hydroxy (Vit-D Deficiency, Fractures)  5. Health care maintenance Check labs, EKG and IC today.  6. Hypertriglyceridemia Decrease carbohydrates, increase exercise, begin meal plan.  7.  Prediabetes We will decrease carbohydrates.  Handout for insulin resistance and prediabetes given to patient today.  Protein shake at 7:30 AM and 3:30 PM.  Check labs today.  - Insulin, random  8. B12 deficiency Check labs today. - Vitamin  B12  9. Depression screening Martin Jones had a positive depression screening. Depression is commonly associated with obesity and often results in emotional eating behaviors. We will monitor this closely and work on CBT to help improve the non-hunger eating patterns. Referral to Psychology may be required if no improvement is seen as he continues in our clinic.  10. Generalized obesity  11. BMI 37.0-37.9, adult Martin Jones is currently in the action stage of change and his goal is to continue with weight loss efforts. I recommend Martin Jones begin the structured treatment plan as follows:  He has agreed to the Category 3 Plan.  Exercise goals: All adults should avoid inactivity. Some physical activity is better than none, and adults who participate in any amount of physical activity gain some health benefits.   Behavioral modification strategies: increasing lean protein intake, decreasing simple carbohydrates, increasing vegetables, increasing water intake, decreasing eating out, no skipping meals, meal planning and cooking strategies, keeping healthy foods in the home, and planning for success.  He was informed of the importance of frequent follow-up visits to maximize his success with intensive lifestyle modifications for his multiple health conditions. He was informed we would discuss his lab results at his next visit unless there is a critical issue that needs to be addressed sooner. Martin Jones agreed to keep his next visit at the agreed upon time to discuss these results.  Objective:   Blood pressure 122/76, pulse 63, temperature 98 F (36.7 C), height 5\' 9"  (1.753 m), weight 256 lb (116.1 kg), SpO2 97 %. Body mass index is 37.8 kg/m.  EKG: Normal sinus rhythm, rate 68 bpm.  Indirect Calorimeter completed today shows a VO2 of 327 and a REE of 2261.  His calculated basal metabolic rate is [id number] thus his basal metabolic rate is worse than expected.  General: Cooperative, alert, well developed, in no  acute distress. HEENT: Conjunctivae and lids unremarkable. Cardiovascular: Regular rhythm.  Lungs: Normal work of breathing. Neurologic: No focal deficits.   Lab Results  Component Value Date   CREATININE 1.22 10/12/2012   BUN 10 10/12/2012   NA 137 10/12/2012   K 4.2 10/12/2012   CL 101 10/12/2012   CO2 27 10/12/2012   No results found for: "ALT", "AST", "GGT", "ALKPHOS", "BILITOT" No results found for: "HGBA1C" Lab Results  Component Value Date   INSULIN 11.2 06/16/2022   No results found for: "TSH" Lab Results  Component Value Date   CHOL 163 10/12/2012   HDL 36 (L) 10/12/2012   LDLCALC 109 (H) 10/12/2012   TRIG 90 10/12/2012   CHOLHDL 4.5 10/12/2012   No results found for: "WBC", "HGB", "HCT", "MCV", "PLT" No results found for: "IRON", "TIBC", "FERRITIN"  Attestation Statements:   Reviewed by clinician on day of visit: allergies, medications, problem list, medical history, surgical history, family history, social history, and previous encounter notes.  Delfina Redwood, am acting as Location manager for [employer], DO.  I have reviewed the above documentation for accuracy and completeness, and I agree with the above. Jearld Lesch, DO

## 2022-06-30 ENCOUNTER — Ambulatory Visit (INDEPENDENT_AMBULATORY_CARE_PROVIDER_SITE_OTHER): Payer: BC Managed Care – PPO | Admitting: Bariatrics

## 2022-06-30 ENCOUNTER — Encounter: Payer: Self-pay | Admitting: Bariatrics

## 2022-06-30 VITALS — BP 130/88 | HR 75 | Temp 97.6°F | Ht 69.0 in | Wt 250.0 lb

## 2022-06-30 DIAGNOSIS — E559 Vitamin D deficiency, unspecified: Secondary | ICD-10-CM

## 2022-06-30 DIAGNOSIS — E669 Obesity, unspecified: Secondary | ICD-10-CM | POA: Diagnosis not present

## 2022-06-30 DIAGNOSIS — Z6837 Body mass index (BMI) 37.0-37.9, adult: Secondary | ICD-10-CM | POA: Diagnosis not present

## 2022-06-30 DIAGNOSIS — E88819 Insulin resistance, unspecified: Secondary | ICD-10-CM

## 2022-06-30 MED ORDER — VITAMIN D (ERGOCALCIFEROL) 1.25 MG (50000 UNIT) PO CAPS: 50000.0000 [IU] | ORAL_CAPSULE | ORAL | 0 refills | Status: DC

## 2022-07-01 NOTE — Progress Notes (Signed)
Chief Complaint:   OBESITY Martin Jones is here to discuss his progress with his obesity treatment plan along with follow-up of his obesity related diagnoses. Martin Jones is on the Category 3 Plan and states he is following his eating plan approximately 80% of the time. Martin Jones states he is walking and at the gym for 60 minutes 2 times per week.  Today's visit was #: 2 Starting weight: 256 lbs Starting date: 06/16/2022 Today's weight: 250 lbs Today's date: 06/30/2022 Total lbs lost to date: 6 Total lbs lost since last in-office visit: 6  Interim History: Martin Jones is down 6 lbs since his last visit. He is eating more than once a day.   Subjective:   1. Vitamin D deficiency Martin Jones's recent Vitamin D level was 7.5.  2. Insulin resistance Martin Jones is eating more than once daily.   Assessment/Plan:   1. Vitamin D deficiency La agrees to start prescription Vitamin D 50,000 IU every week with no refills.   - Vitamin D, Ergocalciferol, (DRISDOL) 1.25 MG (50000 UNIT) CAPS capsule; Take 1 capsule (50,000 Units total) by mouth every 7 (seven) days.  Dispense: 5 capsule; Refill: 0  2. Insulin resistance Information on GLP-1's and oral medications were discussed and provided to the patient today.  Martin Jones will work on increasing his exercise.  3. Generalized obesity  4. BMI 37.0-37.9, adult Martin Jones is currently in the action stage of change. As such, his goal is to continue with weight loss efforts. He has agreed to the Category 3 Plan.   Meal planning and intentional eating were discussed.  Reviewed labs with the patient from 06/16/2022, vitamin D and B12.  Eating Out handout was provided.  Exercise goals: As is.   Behavioral modification strategies: increasing lean protein intake, decreasing simple carbohydrates, increasing vegetables, increasing water intake, decreasing eating out, no skipping meals, meal planning and cooking strategies, keeping healthy foods in the home, and planning  for success.  Martin Jones has agreed to follow-up with our clinic in 2 weeks. He was informed of the importance of frequent follow-up visits to maximize his success with intensive lifestyle modifications for his multiple health conditions.   Objective:   Blood pressure 130/88, pulse 75, temperature 97.6 F (36.4 C), height  (1.753 m), weight 250 lb (113.4 kg), SpO2 96 %. Body mass index is 36.92 kg/m.  General: Cooperative, alert, well developed, in no acute distress. HEENT: Conjunctivae and lids unremarkable. Cardiovascular: Regular rhythm.  Lungs: Normal work of breathing. Neurologic: No focal deficits.   Lab Results  Component Value Date   CREATININE 1.22 10/12/2012   BUN 10 10/12/2012   NA 137 10/12/2012   K 4.2 10/12/2012   CL 101 10/12/2012   CO2 27 10/12/2012   No results found for: "ALT", "AST", "GGT", "ALKPHOS", "BILITOT" No results found for: "HGBA1C" Lab Results  Component Value Date   INSULIN 11.2 06/16/2022   No results found for: "TSH" Lab Results  Component Value Date   CHOL 163 10/12/2012   HDL 36 (L) 10/12/2012   LDLCALC 109 (H) 10/12/2012   TRIG 90 10/12/2012   CHOLHDL 4.5 10/12/2012   Lab Results  Component Value Date   VD25OH 7.5 (L) 06/16/2022   No results found for: "WBC", "HGB", "HCT", "MCV", "PLT" No results found for: "IRON", "TIBC", "FERRITIN"  Attestation Statements:   Reviewed by clinician on day of visit: allergies, medications, problem list, medical history, surgical history, family history, social history, and previous encounter notes.   Trude Mcburney,  am acting as Energy manager for [employer], DO.  I have reviewed the above documentation for accuracy and completeness, and I agree with the above. Corinna Capra, DO

## 2022-07-13 ENCOUNTER — Encounter: Payer: Self-pay | Admitting: Bariatrics

## 2022-07-16 ENCOUNTER — Encounter: Payer: Self-pay | Admitting: Nurse Practitioner

## 2022-07-16 ENCOUNTER — Ambulatory Visit (INDEPENDENT_AMBULATORY_CARE_PROVIDER_SITE_OTHER): Payer: BC Managed Care – PPO | Admitting: Nurse Practitioner

## 2022-07-16 VITALS — BP 134/74 | HR 71 | Temp 98.3°F | Ht 69.0 in | Wt 250.0 lb

## 2022-07-16 DIAGNOSIS — Z6836 Body mass index (BMI) 36.0-36.9, adult: Secondary | ICD-10-CM | POA: Diagnosis not present

## 2022-07-16 DIAGNOSIS — E669 Obesity, unspecified: Secondary | ICD-10-CM

## 2022-07-16 DIAGNOSIS — E559 Vitamin D deficiency, unspecified: Secondary | ICD-10-CM | POA: Diagnosis not present

## 2022-07-16 NOTE — Progress Notes (Signed)
Office: [phone]  /  Fax: [phone]  WEIGHT SUMMARY AND BIOMETRICS  Weight Lost Since Last Visit: 0lb  Weight Gained Since Last Visit: 0lb   Vitals Temp: 98.3 F (36.8 C) BP: 134/74 Pulse Rate: 71 SpO2: 100 %   Anthropometric Measurements Height: 5\' 9"  (1.753 m) Weight: 250 lb (113.4 kg) BMI (Calculated): 36.9 Weight at Last Visit: 250lb Weight Lost Since Last Visit: 0lb Weight Gained Since Last Visit: 0lb Starting Weight: 256lb Total Weight Loss (lbs): 6 lb (2.722 kg)   Body Composition  Body Fat %: 32.6 % Fat Mass (lbs): 81.6 lbs Muscle Mass (lbs): 160.6 lbs Total Body Water (lbs): 122.4 lbs Visceral Fat Rating : 17   Other Clinical Data Fasting: No Labs: No Today's Visit #: 3 Starting Date: 06/16/22     HPI  Chief Complaint: OBESITY  Martin Jones is here to discuss his progress with his obesity treatment plan. He is on the the Category 3 Plan and states he is following his eating plan approximately 85-90 % of the time. He states he is exercising 60 minutes 2 days per week-resistance training and cardio   Interval History:  Since last office visit he has maintained his weight.  Unsure how much protein he is eating daily.  He is eating out 4 days per week.  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  He is eating 2 meals per day.  He drinks coffee, tea and water.  Denies hunger and cravings.     Pharmacotherapy for weight loss: He is not currently taking medications  for medical weight loss.   Bariatric surgery:  Patient has not had bariatric surgery.   Vit D deficiency  He is taking Vit D 50,000 IU weekly.  Denies side effects.  Denies nausea, vomiting or muscle weakness.    Lab Results  Component Value Date   VD25OH 7.5 (L) 06/16/2022     PHYSICAL EXAM:  Blood pressure 134/74, pulse 71, temperature 98.3 F (36.8 C), height 5\' 9"  (1.753 m), weight 250 lb (113.4 kg), SpO2 100 %. Body mass index is 36.92 kg/m.  General: He is  overweight, cooperative, alert, well developed, and in no acute distress. PSYCH: Has normal mood, affect and thought process.   Extremities: No edema.  Neurologic: No gross sensory or motor deficits. No tremors or fasciculations noted.    DIAGNOSTIC DATA REVIEWED:  BMET    Component Value Date/Time   NA 137 10/12/2012 0915   K 4.2 10/12/2012 0915   CL 101 10/12/2012 0915   CO2 27 10/12/2012 0915   GLUCOSE 91 10/12/2012 0915   BUN 10 10/12/2012 0915   CREATININE 1.22 10/12/2012 0915   CALCIUM 9.3 10/12/2012 0915   No results found for: "HGBA1C" Lab Results  Component Value Date   INSULIN 11.2 06/16/2022   No results found for: "TSH" CBC No results found for: "WBC", "RBC", "HGB", "HCT", "PLT", "MCV", "MCH", "MCHC", "RDW" Iron Studies No results found for: "IRON", "TIBC", "FERRITIN", "IRONPCTSAT" Lipid Panel     Component Value Date/Time   CHOL 163 10/12/2012 0915   TRIG 90 10/12/2012 0915   HDL 36 (L) 10/12/2012 0915   CHOLHDL 4.5 10/12/2012 0915   VLDL 18 10/12/2012 0915   LDLCALC 109 (H) 10/12/2012 0915   Hepatic Function Panel  No results found for: "PROT", "ALBUMIN", "AST", "ALT", "ALKPHOS", "BILITOT", "BILIDIR", "IBILI" No results found for: "TSH" Nutritional Lab Results  Component Value Date   VD25OH 7.5 (L) 06/16/2022     ASSESSMENT  AND PLAN  TREATMENT PLAN FOR OBESITY:  Recommended Dietary Goals  Martin Jones is currently in the action stage of change. As such, his goal is to continue weight management plan. He has agreed to the Category 3 Plan.  Behavioral Intervention  We discussed the following Behavioral Modification Strategies today: increasing lean protein intake, decreasing simple carbohydrates , increasing vegetables, increasing lower glycemic fruits, increasing fiber rich foods, avoiding skipping meals, increasing water intake, continue to practice mindfulness when eating, and planning for success.  Additional resources provided today:  NA  Recommended Physical Activity Goals  Martin Jones has been advised to work up to 150 minutes of moderate intensity aerobic activity a week and strengthening exercises 2-3 times per week for cardiovascular health, weight loss maintenance and preservation of muscle mass.   He has agreed to Continue current level of physical activity   ASSOCIATED CONDITIONS ADDRESSED TODAY  Action/Plan  Vitamin D deficiency Continue Vit D as directed  Low Vitamin D level contributes to fatigue and are associated with obesity, breast, and colon cancer. He agrees to continue to take prescription Vitamin D @50 ,000 IU every week and will follow-up for routine testing of Vitamin D, at least 2-3 times per year to avoid over-replacement.   Generalized obesity  BMI 36.0-36.9,adult         Return in about 2 weeks (around 07/30/2022).Marland Kitchen He was informed of the importance of frequent follow up visits to maximize his success with intensive lifestyle modifications for his multiple health conditions.   ATTESTASTION STATEMENTS:  Reviewed by clinician on day of visit: allergies, medications, problem list, medical history, surgical history, family history, social history, and previous encounter notes.   Time spent on visit including pre-visit chart review and post-visit care and charting was 30 minutes.    Theodis Sato. Annali Lybrand FNP-C

## 2022-07-27 DIAGNOSIS — E669 Obesity, unspecified: Secondary | ICD-10-CM | POA: Diagnosis not present

## 2022-07-30 ENCOUNTER — Encounter: Payer: Self-pay | Admitting: Bariatrics

## 2022-07-30 ENCOUNTER — Ambulatory Visit (INDEPENDENT_AMBULATORY_CARE_PROVIDER_SITE_OTHER): Payer: BC Managed Care – PPO | Admitting: Bariatrics

## 2022-07-30 VITALS — BP 122/78 | HR 70 | Temp 98.4°F | Ht 69.0 in | Wt 246.0 lb

## 2022-07-30 DIAGNOSIS — Z6836 Body mass index (BMI) 36.0-36.9, adult: Secondary | ICD-10-CM

## 2022-07-30 DIAGNOSIS — E559 Vitamin D deficiency, unspecified: Secondary | ICD-10-CM

## 2022-07-30 DIAGNOSIS — E88819 Insulin resistance, unspecified: Secondary | ICD-10-CM

## 2022-07-30 DIAGNOSIS — E669 Obesity, unspecified: Secondary | ICD-10-CM | POA: Diagnosis not present

## 2022-07-30 MED ORDER — VITAMIN D (ERGOCALCIFEROL) 1.25 MG (50000 UNIT) PO CAPS: 50000.0000 [IU] | ORAL_CAPSULE | ORAL | 0 refills | Status: DC

## 2022-07-30 NOTE — Progress Notes (Signed)
WEIGHT SUMMARY AND BIOMETRICS  Weight Lost Since Last Visit: 4lb  Vitals Temp: 98.4 F (36.9 C) BP: 122/78 Pulse Rate: 70 SpO2: 97 %   Anthropometric Measurements Height: 5\' 9"  (1.753 m) Weight: 246 lb (111.6 kg) BMI (Calculated): 36.31 Weight at Last Visit: 250lb Weight Lost Since Last Visit: 4lb Starting Weight: 256lb Total Weight Loss (lbs): 10 lb (4.536 kg)   Body Composition  Body Fat %: 32.2 % Fat Mass (lbs): 79.2 lbs Muscle Mass (lbs): 158.6 lbs Total Body Water (lbs): 115 lbs Visceral Fat Rating : 17   Other Clinical Data Fasting: no Labs: no Today's Visit #: 4 Starting Date: 06/16/22    OBESITY Remmy is here to discuss his progress with his obesity treatment plan along with follow-up of his obesity related diagnoses.     Nutrition Plan: the Category 3 plan - 90% adherence.  Current exercise:  Gym  Interim History:  He is down an additional 4 lbs since his last visit.  Is skipping meals  Hunger is moderately controlled.  Cravings are moderately controlled.  Assessment/Plan:   1. Vitamin D Deficiency Vitamin D is not at goal of 50.  Most recent vitamin D level was 7.5. He is on  prescription ergocalciferol 50,000 IU weekly. Lab Results  Component Value Date   VD25OH 7.5 (L) 06/16/2022    Plan: Refill prescription vitamin D 50,000 IU weekly.   Insulin Resistance Cailean has had elevated fasting insulin readings. Goal is HgbA1c < 5.7, fasting insulin at l0 or less, and preferably at 5.  He  denies polyphagia. No results found for: "HGBA1C" Lab Results  Component Value Date   INSULIN 11.2 06/16/2022    Plan Will work on the agreed upon plan. Will minimize refined carbohydrates ( sweets and starches), and focus more on complex carbohydrates.  Increase the micronutrients found in leafy greens, which include magnesium, polyphenols, and vitamin C which have been postulated to help with insulin sensitivity. Minimize "fast  food" and cook more meals at home.  Increase fiber to 25 to 30 grams daily.    Generalized Obesity: Current BMI BMI (Calculated): 36.31    Bryden is currently in the action stage of change. As such, his goal is to continue with weight loss efforts.  He has agreed to the Category 3 plan.  Exercise goals: For substantial health benefits, adults should do at least 150 minutes (2 hours and 30 minutes) a week of moderate-intensity, or 75 minutes (1 hour and 15 minutes) a week of vigorous-intensity aerobic physical activity, or an equivalent combination of moderate- and vigorous-intensity aerobic activity. Aerobic activity should be performed in episodes of at least 10 minutes, and preferably, it should be spread throughout the week.  Behavioral modification strategies: no meal skipping, increase water intake, and planning for success.  Hollice has agreed to follow-up with our clinic in 2 weeks.        Objective:   VITALS: Per patient if applicable, see vitals. GENERAL: Alert and in no acute distress. CARDIOPULMONARY: No increased WOB. Speaking in clear sentences.  PSYCH: Pleasant and cooperative. Speech normal rate and rhythm. Affect is appropriate. Insight and judgement are appropriate. Attention is focused, linear, and appropriate.  NEURO: Oriented as arrived to appointment on time with no prompting.   Attestation Statements:   This was prepared with the assistance of Engineer, civil (consulting).  Occasional wrong-word or sound-a-like substitutions may have occurred due to the inherent limitations of voice recognition software.   Corinna Capra, DO

## 2022-08-20 ENCOUNTER — Encounter: Payer: Self-pay | Admitting: Bariatrics

## 2022-08-20 ENCOUNTER — Ambulatory Visit (INDEPENDENT_AMBULATORY_CARE_PROVIDER_SITE_OTHER): Payer: BC Managed Care – PPO | Admitting: Bariatrics

## 2022-08-20 VITALS — BP 127/88 | HR 80 | Temp 98.1°F | Ht 69.0 in | Wt 245.0 lb

## 2022-08-20 DIAGNOSIS — F509 Eating disorder, unspecified: Secondary | ICD-10-CM | POA: Diagnosis not present

## 2022-08-20 DIAGNOSIS — Z6836 Body mass index (BMI) 36.0-36.9, adult: Secondary | ICD-10-CM

## 2022-08-20 DIAGNOSIS — E88819 Insulin resistance, unspecified: Secondary | ICD-10-CM

## 2022-08-20 DIAGNOSIS — E669 Obesity, unspecified: Secondary | ICD-10-CM

## 2022-08-20 DIAGNOSIS — E559 Vitamin D deficiency, unspecified: Secondary | ICD-10-CM | POA: Diagnosis not present

## 2022-08-20 MED ORDER — VITAMIN D (ERGOCALCIFEROL) 1.25 MG (50000 UNIT) PO CAPS: 50000.0000 [IU] | ORAL_CAPSULE | ORAL | 0 refills | Status: DC

## 2022-08-20 MED ORDER — BUPROPION HCL ER (SR) 150 MG PO TB12: 150.0000 mg | ORAL_TABLET | Freq: Every day | ORAL | 0 refills | Status: DC

## 2022-08-24 NOTE — Progress Notes (Unsigned)
Chief Complaint:   OBESITY Martin Jones is here to discuss his progress with his obesity treatment plan along with follow-up of his obesity related diagnoses. Martin Jones is on the Category 3 Plan and states he is following his eating plan approximately 90% of the time. Martin Jones states he is lifting weights and doing cardio for 60 minutes 2 times per week.  Today's visit was #: 5 Starting weight: 256 lbs Starting date: 06/16/2022 Today's weight: 245 lbs Today's date: 08/20/2022 Total lbs lost to date: 11 Total lbs lost since last in-office visit: 1  Interim History: Patient is down 1 lb since his last visit. He is struggling with drinking tea. His energy and sleep is good. He is doing well with his protein.   Subjective:   1. Vitamin D deficiency Patient is taking Vitamin D as directed. Last Vitamin D level was 7.5.  2. Insulin resistance Patient is not on medications.   3. Eating disorder, unspecified type Patient increased sweets and drinking tea with sugar.   Assessment/Plan:   1. Vitamin D deficiency Patient will continue prescription Vitamin D, and we will refill for 1 month.   - Vitamin D, Ergocalciferol, (DRISDOL) 1.25 MG (50000 UNIT) CAPS capsule; Take 1 capsule (50,000 Units total) by mouth every 7 (seven) days.  Dispense: 5 capsule; Refill: 0  2. Insulin resistance Patient will continue to eliminate most carbohydrates (sweets and starches).   3. Eating disorder, unspecified type Patient agreed to start Wellbutrin SR 150 mg daily with no refills.   - buPROPion (WELLBUTRIN SR) 150 MG 12 hr tablet; Take 1 tablet (150 mg total) by mouth daily.  Dispense: 30 tablet; Refill: 0  4. Generalized obesity  5. BMI 36.0-36.9,adult Martin Jones is currently in the action stage of change. As such, his goal is to continue with weight loss efforts. He has agreed to the Category 3 Plan.   Meal planning and intentional eating were discussed. Will try monk fruit instead of sugar.    Exercise goals: As is.   Behavioral modification strategies: increasing lean protein intake, decreasing simple carbohydrates, increasing vegetables, increasing water intake, decreasing eating out, no skipping meals, meal planning and cooking strategies, keeping healthy foods in the home, and planning for success.  Martin Jones has agreed to follow-up with our clinic in 3 weeks. He was informed of the importance of frequent follow-up visits to maximize his success with intensive lifestyle modifications for his multiple health conditions.   Objective:   Blood pressure 127/88, pulse 80, temperature 98.1 F (36.7 C), height 5\' 9"  (1.753 m), weight 245 lb (111.1 kg), SpO2 97 %. Body mass index is 36.18 kg/m.  General: Cooperative, alert, well developed, in no acute distress. HEENT: Conjunctivae and lids unremarkable. Cardiovascular: Regular rhythm.  Lungs: Normal work of breathing. Neurologic: No focal deficits.   Lab Results  Component Value Date   CREATININE 1.22 10/12/2012   BUN 10 10/12/2012   NA 137 10/12/2012   K 4.2 10/12/2012   CL 101 10/12/2012   CO2 27 10/12/2012   No results found for: "ALT", "AST", "GGT", "ALKPHOS", "BILITOT" No results found for: "HGBA1C" Lab Results  Component Value Date   INSULIN 11.2 06/16/2022   No results found for: "TSH" Lab Results  Component Value Date   CHOL 163 10/12/2012   HDL 36 (L) 10/12/2012   LDLCALC 109 (H) 10/12/2012   TRIG 90 10/12/2012   CHOLHDL 4.5 10/12/2012   Lab Results  Component Value Date   VD25OH 7.5 (L) 06/16/2022  No results found for: "WBC", "HGB", "HCT", "MCV", "PLT" No results found for: "IRON", "TIBC", "FERRITIN"  Attestation Statements:   Reviewed by clinician on day of visit: allergies, medications, problem list, medical history, surgical history, family history, social history, and previous encounter notes.   Trude Mcburney, am acting as Energy manager for [employer], DO.  I have reviewed the  above documentation for accuracy and completeness, and I agree with the above. Corinna Capra, DO

## 2022-08-27 ENCOUNTER — Encounter: Payer: Self-pay | Admitting: Bariatrics

## 2022-09-21 ENCOUNTER — Ambulatory Visit (INDEPENDENT_AMBULATORY_CARE_PROVIDER_SITE_OTHER): Payer: Managed Care, Other (non HMO) | Admitting: Nurse Practitioner

## 2022-09-21 ENCOUNTER — Encounter: Payer: Self-pay | Admitting: Nurse Practitioner

## 2022-09-21 VITALS — BP 127/83 | HR 69 | Temp 98.3°F | Ht 69.0 in | Wt 243.0 lb

## 2022-09-21 DIAGNOSIS — E559 Vitamin D deficiency, unspecified: Secondary | ICD-10-CM | POA: Diagnosis not present

## 2022-09-21 DIAGNOSIS — F509 Eating disorder, unspecified: Secondary | ICD-10-CM | POA: Diagnosis not present

## 2022-09-21 DIAGNOSIS — Z6835 Body mass index (BMI) 35.0-35.9, adult: Secondary | ICD-10-CM

## 2022-09-21 DIAGNOSIS — E669 Obesity, unspecified: Secondary | ICD-10-CM | POA: Diagnosis not present

## 2022-09-21 MED ORDER — BUPROPION HCL ER (SR) 150 MG PO TB12: 150.0000 mg | ORAL_TABLET | Freq: Every day | ORAL | 0 refills | Status: DC

## 2022-09-21 MED ORDER — VITAMIN D (ERGOCALCIFEROL) 1.25 MG (50000 UNIT) PO CAPS: 50000.0000 [IU] | ORAL_CAPSULE | ORAL | 0 refills | Status: AC

## 2022-09-21 NOTE — Progress Notes (Signed)
Office: [phone]  /  Fax: [phone]  WEIGHT SUMMARY AND BIOMETRICS  Weight Lost Since Last Visit: 2lb  No data recorded  Vitals Temp: 98.3 F (36.8 C) BP: 127/83 Pulse Rate: 69 SpO2: 96 %   Anthropometric Measurements Height: 5\' 9"  (1.753 m) Weight: 243 lb (110.2 kg) BMI (Calculated): 35.87 Weight at Last Visit: 245lb Weight Lost Since Last Visit: 2lb Starting Weight: 256lb Total Weight Loss (lbs): 13 lb (5.897 kg)   Body Composition  Body Fat %: 32 % Fat Mass (lbs): 78 lbs Muscle Mass (lbs): 157.6 lbs Total Body Water (lbs): 117.2 lbs Visceral Fat Rating : 16   Other Clinical Data Fasting: No Labs: No Today's Visit #: 6 Starting Date: 06/16/22     HPI  Chief Complaint: OBESITY  Martin Jones is here to discuss his progress with his obesity treatment plan. He is on the the Category 3 Plan and states he is following his eating plan approximately 75 % of the time. He states he is exercising 60 minutes 2 days per week-cardio.   Interval History:  Since last office visit he has lost 2 pounds.  He has been on vacation since his last visit and has gotten off track.  He is planning to get back on track since he has gotten home.  He is drinking water, tea and lemonade daily. Sometimes he will drink a protein shake for lunch when he can't eat at work.  He's interested in starting a low carb, high protein plan.     Pharmacotherapy for weight loss: He is currently taking Wellbutrin SR 150mg  in the am for cravings.  Denies side effects.  Struggles with cravings for sugar after he gets home for work.    Vit D deficiency  He is taking Vit D 50,000 IU weekly.  Denies side effects.  Denies nausea, vomiting or muscle weakness.    Lab Results  Component Value Date   VD25OH 7.5 (L) 06/16/2022     PHYSICAL EXAM:  Blood pressure 127/83, pulse 69, temperature 98.3 F (36.8 C), height 5\' 9"  (1.753 m), weight 243 lb (110.2 kg), SpO2 96 %. Body mass index is 35.88  kg/m.  General: He is overweight, cooperative, alert, well developed, and in no acute distress. PSYCH: Has normal mood, affect and thought process.   Extremities: No edema.  Neurologic: No gross sensory or motor deficits. No tremors or fasciculations noted.    DIAGNOSTIC DATA REVIEWED:  BMET    Component Value Date/Time   NA 137 10/12/2012 0915   K 4.2 10/12/2012 0915   CL 101 10/12/2012 0915   CO2 27 10/12/2012 0915   GLUCOSE 91 10/12/2012 0915   BUN 10 10/12/2012 0915   CREATININE 1.22 10/12/2012 0915   CALCIUM 9.3 10/12/2012 0915   No results found for: "HGBA1C" Lab Results  Component Value Date   INSULIN 11.2 06/16/2022   No results found for: "TSH" CBC No results found for: "WBC", "RBC", "HGB", "HCT", "PLT", "MCV", "MCH", "MCHC", "RDW" Iron Studies No results found for: "IRON", "TIBC", "FERRITIN", "IRONPCTSAT" Lipid Panel     Component Value Date/Time   CHOL 163 10/12/2012 0915   TRIG 90 10/12/2012 0915   HDL 36 (L) 10/12/2012 0915   CHOLHDL 4.5 10/12/2012 0915   VLDL 18 10/12/2012 0915   LDLCALC 109 (H) 10/12/2012 0915   Hepatic Function Panel  No results found for: "PROT", "ALBUMIN", "AST", "ALT", "ALKPHOS", "BILITOT", "BILIDIR", "IBILI" No results found for: "TSH" Nutritional Lab Results  Component Value Date  VD25OH 7.5 (L) 06/16/2022     ASSESSMENT AND PLAN  TREATMENT PLAN FOR OBESITY:  Recommended Dietary Goals  Martin Jones is currently in the action stage of change. As such, his goal is to continue weight management plan. He has agreed to following a lower carbohydrate, vegetable and lean protein rich diet plan. Information given today.  He would like to continue 1 fruit daily.    Behavioral Intervention  We discussed the following Behavioral Modification Strategies today: increasing lean protein intake, decreasing simple carbohydrates , increasing vegetables, increasing lower glycemic fruits, avoiding skipping meals, increasing water intake,  continue to practice mindfulness when eating, and planning for success.  Additional resources provided today: NA  Recommended Physical Activity Goals  Martin Jones has been advised to work up to 150 minutes of moderate intensity aerobic activity a week and strengthening exercises 2-3 times per week for cardiovascular health, weight loss maintenance and preservation of muscle mass.   He has agreed to Continue current level of physical activity    ASSOCIATED CONDITIONS ADDRESSED TODAY  Action/Plan  Vitamin D deficiency -     Vitamin D (Ergocalciferol); Take 1 capsule (50,000 Units total) by mouth every 7 (seven) days.  Dispense: 5 capsule; Refill: 0  Low Vitamin D level contributes to fatigue and are associated with obesity, breast, and colon cancer. He agrees to continue to take prescription Vitamin D @50 ,000 IU every week and will follow-up for routine testing of Vitamin D, at least 2-3 times per year to avoid over-replacement.   Eating disorder, unspecified type -     buPROPion HCl ER (SR); Take 1 tablet (150 mg total) by mouth daily.  Dispense: 30 tablet; Refill: 0 Will continue but start taking at lunch to see if helps with evening cravings.  Consider bid dosing.   Generalized obesity  BMI 35.0-35.9,adult       Will obtain fasting labs at next visit.    Return in about 4 weeks (around 10/19/2022).Marland Kitchen He was informed of the importance of frequent follow up visits to maximize his success with intensive lifestyle modifications for his multiple health conditions.   ATTESTASTION STATEMENTS:  Reviewed by clinician on day of visit: allergies, medications, problem list, medical history, surgical history, family history, social history, and previous encounter notes.    Theodis Sato. Amed Datta FNP-C

## 2022-09-24 ENCOUNTER — Ambulatory Visit: Payer: BC Managed Care – PPO | Admitting: Nurse Practitioner

## 2022-10-19 ENCOUNTER — Ambulatory Visit: Payer: BC Managed Care – PPO | Admitting: Bariatrics

## 2022-10-27 ENCOUNTER — Other Ambulatory Visit: Payer: Self-pay | Admitting: Nurse Practitioner

## 2022-10-27 DIAGNOSIS — F509 Eating disorder, unspecified: Secondary | ICD-10-CM

## 2022-10-28 ENCOUNTER — Ambulatory Visit (INDEPENDENT_AMBULATORY_CARE_PROVIDER_SITE_OTHER): Payer: Managed Care, Other (non HMO) | Admitting: Bariatrics

## 2022-10-28 ENCOUNTER — Encounter: Payer: Self-pay | Admitting: Bariatrics

## 2022-10-28 VITALS — BP 130/90 | HR 60 | Temp 98.3°F | Ht 69.0 in | Wt 247.0 lb

## 2022-10-28 DIAGNOSIS — E669 Obesity, unspecified: Secondary | ICD-10-CM | POA: Diagnosis not present

## 2022-10-28 DIAGNOSIS — Z6836 Body mass index (BMI) 36.0-36.9, adult: Secondary | ICD-10-CM

## 2022-10-28 DIAGNOSIS — F509 Eating disorder, unspecified: Secondary | ICD-10-CM

## 2022-10-28 NOTE — Progress Notes (Signed)
WEIGHT SUMMARY AND BIOMETRICS  Weight Lost Since Last Visit: 0lb  Weight Gained Since Last Visit: 4lb   Vitals Temp: 98.3 F (36.8 C) BP: (!) 130/90 Pulse Rate: 60 SpO2: 97 %   Anthropometric Measurements Height: 5\' 9"  (1.753 m) Weight: 247 lb (112 kg) BMI (Calculated): 36.46 Weight at Last Visit: 243lb Weight Lost Since Last Visit: 0lb Weight Gained Since Last Visit: 4lb Starting Weight: 256lb Total Weight Loss (lbs): 9 lb (4.082 kg)   Body Composition  Body Fat %: 32.1 % Fat Mass (lbs): 79.4 lbs Muscle Mass (lbs): 159.8 lbs Total Body Water (lbs): 117 lbs Visceral Fat Rating : 17   Other Clinical Data Fasting: No Labs: No Today's Visit #: 7 Starting Date: 06/16/22    OBESITY Martin Jones is here to discuss his progress with his obesity treatment plan along with follow-up of his obesity related diagnoses.     Nutrition Plan: low carbohydrate plan - 70% adherence.  Current exercise: cardiovascular workout on exercise equipment and weightlifting  Interim History:  He is up 4 lbs since his last visit. He is not journaling and he is drinking sweet tea.  Eating all of the food on the plan., Is skipping meals, and Water intake is adequate.  Hunger is moderately controlled.  Cravings are moderately controlled.  Assessment/Plan:   Eating disorder/emotional eating Martin Jones has had issues with stress eating, emotional eating, nighttime eating, and boredom eating. Currently this is moderately controlled. Overall mood is stable. Denies suicidal/homicidal ideation. Medication(s): Wellbutrin 150 mg daily in the am (does not think that medication is working)  Plan:  Specifically regarding patient's less desirable eating habits and patterns, we employed the technique of small changes when he cannot fully commit to his prudent nutritional plan.  Discussed distractions to curb eating behaviors. Discussed activities to do with one's hands in the evening  Be  sure to get adequate rest as lack of rest can trigger appetite.  Have plan in place for stressful events.  Consider other rewards besides food.   Pharmacotherapy Plan Discontinue  Wellbutrin     Generalized Obesity: Current BMI BMI (Calculated): 36.46    Martin Jones is currently in the action stage of change. As such, his goal is to continue with weight loss efforts.  He has agreed to resume journaling with 1,500 calories and 90 to 110 grams of protein. He will stop all sweet tea.    Exercise goals: For substantial health benefits, adults should do at least 150 minutes (2 hours and 30 minutes) a week of moderate-intensity, or 75 minutes (1 hour and 15 minutes) a week of vigorous-intensity aerobic physical activity, or an equivalent combination of moderate- and vigorous-intensity aerobic activity. Aerobic activity should be performed in episodes of at least 10 minutes, and preferably, it should be spread throughout the week. He will increase his cardio exercise to 3 days a week.   Behavioral modification strategies: increasing lean protein intake, decreasing simple carbohydrates , no meal skipping, meal planning , increase water intake, better snacking choices, planning for success, avoiding temptations, and mindful eating.  Martin Jones has agreed to follow-up with our clinic in 3 weeks.      Objective:   VITALS: Per patient if applicable, see vitals. GENERAL: Alert and in no acute distress. CARDIOPULMONARY: No increased WOB. Speaking in clear sentences.  PSYCH: Pleasant and cooperative. Speech normal rate and rhythm. Affect is appropriate. Insight and judgement are appropriate. Attention is focused, linear, and appropriate.  NEURO: Oriented as arrived to appointment on  time with no prompting.   Attestation Statements:   This was prepared with the assistance of Engineer, civil (consulting).  Occasional wrong-word or sound-a-like substitutions may have occurred due to the inherent limitations of  voice recognition software.   Corinna Capra, DO

## 2022-11-26 ENCOUNTER — Ambulatory Visit: Payer: BLUE CROSS/BLUE SHIELD | Admitting: Nurse Practitioner

## 2022-11-26 ENCOUNTER — Encounter: Payer: Self-pay | Admitting: Nurse Practitioner

## 2022-11-26 VITALS — BP 131/90 | HR 77 | Temp 98.6°F | Ht 69.0 in | Wt 250.0 lb

## 2022-11-26 DIAGNOSIS — E65 Localized adiposity: Secondary | ICD-10-CM

## 2022-11-26 DIAGNOSIS — Z6836 Body mass index (BMI) 36.0-36.9, adult: Secondary | ICD-10-CM

## 2022-11-26 DIAGNOSIS — E669 Obesity, unspecified: Secondary | ICD-10-CM

## 2022-11-26 NOTE — Progress Notes (Signed)
Office: [phone]  /  Fax: [phone]  WEIGHT SUMMARY AND BIOMETRICS  Weight Lost Since Last Visit: 0lb  Weight Gained Since Last Visit: 3lb   Vitals Temp: 98.6 F (37 C) BP: (!) 131/90 Pulse Rate: 77 SpO2: 97 %   Anthropometric Measurements Height: 5\' 9"  (1.753 m) Weight: 250 lb (113.4 kg) BMI (Calculated): 36.9 Weight at Last Visit: 247lb Weight Lost Since Last Visit: 0lb Weight Gained Since Last Visit: 3lb Starting Weight: 256lb Total Weight Loss (lbs): 6 lb (2.722 kg)   Body Composition  Body Fat %: 32.7 % Fat Mass (lbs): 81.8 lbs Muscle Mass (lbs): 160.4 lbs Total Body Water (lbs): 117.6 lbs Visceral Fat Rating : 17   Other Clinical Data Fasting: No Labs: No Today's Visit #: 8 Starting Date: 06/16/22     HPI  Chief Complaint: OBESITY  Martin Jones is here to discuss his progress with his obesity treatment plan. He is on the following a lower carbohydrate, vegetable and lean protein rich diet plan and states he is following his eating plan approximately 60 % of the time. He states he is exercising 60 minutes 2 days per week-50 mins cardio and 40 mins resistance training.   Interval History:  Since last office visit he has gained 3 pounds. He is eating 2 meals and 2 snacks per day.  Snacks; cheese, crackers and protein shake.  He is drinking tea and water.   He is starting a new job and is getting new insurance   Pharmacotherapy for weight loss: He is not currently taking medications  for medical weight loss.   Previous pharmacotherapy for medical weight loss:  Wellbutrin  SR 150mg -stopped due to not working   Bariatric surgery:  Patient has not had bariatric surgery   Visceral obesity Rating today was 17 per bio impedence scale.    PHYSICAL EXAM:  Blood pressure (!) 131/90, pulse 77, temperature 98.6 F (37 C), height 5\' 9"  (1.753 m), weight 250 lb (113.4 kg), SpO2 97%. Body mass index is 36.92 kg/m.  General: He is overweight,  cooperative, alert, well developed, and in no acute distress. PSYCH: Has normal mood, affect and thought process.   Extremities: No edema.  Neurologic: No gross sensory or motor deficits. No tremors or fasciculations noted.    DIAGNOSTIC DATA REVIEWED:  BMET    Component Value Date/Time   NA 137 10/12/2012 0915   K 4.2 10/12/2012 0915   CL 101 10/12/2012 0915   CO2 27 10/12/2012 0915   GLUCOSE 91 10/12/2012 0915   BUN 10 10/12/2012 0915   CREATININE 1.22 10/12/2012 0915   CALCIUM 9.3 10/12/2012 0915   No results found for: "HGBA1C" Lab Results  Component Value Date   INSULIN 11.2 06/16/2022   No results found for: "TSH" CBC No results found for: "WBC", "RBC", "HGB", "HCT", "PLT", "MCV", "MCH", "MCHC", "RDW" Iron Studies No results found for: "IRON", "TIBC", "FERRITIN", "IRONPCTSAT" Lipid Panel     Component Value Date/Time   CHOL 163 10/12/2012 0915   TRIG 90 10/12/2012 0915   HDL 36 (L) 10/12/2012 0915   CHOLHDL 4.5 10/12/2012 0915   VLDL 18 10/12/2012 0915   LDLCALC 109 (H) 10/12/2012 0915   Hepatic Function Panel  No results found for: "PROT", "ALBUMIN", "AST", "ALT", "ALKPHOS", "BILITOT", "BILIDIR", "IBILI" No results found for: "TSH" Nutritional Lab Results  Component Value Date   VD25OH 7.5 (L) 06/16/2022     ASSESSMENT AND PLAN  TREATMENT PLAN FOR OBESITY:  Recommended Dietary Goals  Martin Jones is currently in the action stage of change. As such, his goal is to continue weight management plan. He has agreed to to track and will review at next visit.  I would like him to weigh and measure his food.   Behavioral Intervention  We discussed the following Behavioral Modification Strategies today: increasing lean protein intake, decreasing simple carbohydrates , increasing vegetables, increasing lower glycemic fruits, increasing water intake, work on tracking and journaling calories using tracking application, continue to practice mindfulness when eating, and  planning for success.  Additional resources provided today: NA  Recommended Physical Activity Goals  Martin Jones has been advised to work up to 150 minutes of moderate intensity aerobic activity a week and strengthening exercises 2-3 times per week for cardiovascular health, weight loss maintenance and preservation of muscle mass.   He has agreed to Continue current level of physical activity     ASSOCIATED CONDITIONS ADDRESSED TODAY  Action/Plan  Visceral obesity To continue working on dietary changes, exercise and weight loss. Discussed tracking to review carb intake.  Discussed exercise extensively today.    Generalized obesity  BMI 36.0-36.9,adult       Return in about 3 months (around 02/25/2023).Marland Kitchen He was informed of the importance of frequent follow up visits to maximize his success with intensive lifestyle modifications for his multiple health conditions.   ATTESTASTION STATEMENTS:  Reviewed by clinician on day of visit: allergies, medications, problem list, medical history, surgical history, family history, social history, and previous encounter notes.   Time spent on visit including pre-visit chart review and post-visit care and charting was 30 minutes.    Theodis Sato. Bethann Qualley FNP-C

## 2023-02-25 ENCOUNTER — Ambulatory Visit: Payer: BLUE CROSS/BLUE SHIELD | Admitting: Nurse Practitioner

## 2023-08-21 ENCOUNTER — Other Ambulatory Visit: Payer: Self-pay | Admitting: Medical Genetics

## 2024-01-04 ENCOUNTER — Other Ambulatory Visit: Payer: Self-pay | Admitting: Medical Genetics

## 2024-01-04 DIAGNOSIS — Z006 Encounter for examination for normal comparison and control in clinical research program: Secondary | ICD-10-CM
